# Patient Record
Sex: Female | Born: 1955 | Race: White | Hispanic: No | Smoking: Former smoker
Health system: Southern US, Community
[De-identification: ages and names within clinical notes are randomized; demographics above are authoritative.]

## PROBLEM LIST (undated history)

## (undated) DIAGNOSIS — Z8489 Family history of other specified conditions: Secondary | ICD-10-CM

## (undated) DIAGNOSIS — J329 Chronic sinusitis, unspecified: Secondary | ICD-10-CM

## (undated) DIAGNOSIS — E785 Hyperlipidemia, unspecified: Secondary | ICD-10-CM

## (undated) DIAGNOSIS — M1990 Unspecified osteoarthritis, unspecified site: Secondary | ICD-10-CM

## (undated) DIAGNOSIS — I10 Essential (primary) hypertension: Secondary | ICD-10-CM

## (undated) DIAGNOSIS — I739 Peripheral vascular disease, unspecified: Secondary | ICD-10-CM

## (undated) DIAGNOSIS — J439 Emphysema, unspecified: Secondary | ICD-10-CM

## (undated) DIAGNOSIS — R7303 Prediabetes: Secondary | ICD-10-CM

## (undated) DIAGNOSIS — Z720 Tobacco use: Secondary | ICD-10-CM

## (undated) DIAGNOSIS — R918 Other nonspecific abnormal finding of lung field: Secondary | ICD-10-CM

## (undated) DIAGNOSIS — M810 Age-related osteoporosis without current pathological fracture: Secondary | ICD-10-CM

## (undated) HISTORY — DX: Tobacco use: Z72.0

## (undated) HISTORY — PX: CATARACT EXTRACTION: SUR2

## (undated) HISTORY — PX: NASAL SINUS SURGERY: SHX719

---

## 1998-10-07 ENCOUNTER — Other Ambulatory Visit: Admission: RE | Admit: 1998-10-07 | Discharge: 1998-10-07 | Payer: Self-pay | Admitting: Obstetrics and Gynecology

## 1998-10-28 ENCOUNTER — Other Ambulatory Visit: Admission: RE | Admit: 1998-10-28 | Discharge: 1998-10-28 | Payer: Self-pay | Admitting: Obstetrics and Gynecology

## 1999-12-08 ENCOUNTER — Other Ambulatory Visit: Admission: RE | Admit: 1999-12-08 | Discharge: 1999-12-08 | Payer: Self-pay | Admitting: Obstetrics and Gynecology

## 2000-02-05 ENCOUNTER — Other Ambulatory Visit: Admission: RE | Admit: 2000-02-05 | Discharge: 2000-02-05 | Payer: Self-pay | Admitting: Obstetrics and Gynecology

## 2010-09-18 ENCOUNTER — Other Ambulatory Visit: Payer: Self-pay | Admitting: Family Medicine

## 2010-09-18 ENCOUNTER — Other Ambulatory Visit (HOSPITAL_COMMUNITY)
Admission: RE | Admit: 2010-09-18 | Discharge: 2010-09-18 | Disposition: A | Discharge status: Home / Self Care | Payer: 59 | Source: Ambulatory Visit | Attending: Family Medicine | Admitting: Family Medicine

## 2010-09-18 DIAGNOSIS — Z124 Encounter for screening for malignant neoplasm of cervix: Secondary | ICD-10-CM | POA: Insufficient documentation

## 2010-09-18 DIAGNOSIS — Z1159 Encounter for screening for other viral diseases: Secondary | ICD-10-CM | POA: Insufficient documentation

## 2014-03-08 ENCOUNTER — Ambulatory Visit (HOSPITAL_COMMUNITY)
Admission: RE | Admit: 2014-03-08 | Discharge: 2014-03-08 | Disposition: A | Discharge status: Home / Self Care | Payer: 59 | Source: Ambulatory Visit | Attending: Chiropractic Medicine | Admitting: Chiropractic Medicine

## 2014-03-08 ENCOUNTER — Other Ambulatory Visit (HOSPITAL_COMMUNITY): Payer: Self-pay | Admitting: Chiropractic Medicine

## 2014-03-08 DIAGNOSIS — M25519 Pain in unspecified shoulder: Secondary | ICD-10-CM | POA: Insufficient documentation

## 2014-03-08 DIAGNOSIS — M25819 Other specified joint disorders, unspecified shoulder: Secondary | ICD-10-CM | POA: Insufficient documentation

## 2014-03-08 DIAGNOSIS — M25512 Pain in left shoulder: Secondary | ICD-10-CM

## 2014-07-09 ENCOUNTER — Other Ambulatory Visit (HOSPITAL_COMMUNITY)
Admission: RE | Admit: 2014-07-09 | Discharge: 2014-07-09 | Disposition: A | Discharge status: Home / Self Care | Payer: 59 | Source: Ambulatory Visit | Attending: Family Medicine | Admitting: Family Medicine

## 2014-07-09 ENCOUNTER — Other Ambulatory Visit: Payer: Self-pay | Admitting: Family Medicine

## 2014-07-09 DIAGNOSIS — Z1151 Encounter for screening for human papillomavirus (HPV): Secondary | ICD-10-CM | POA: Insufficient documentation

## 2014-07-09 DIAGNOSIS — Z124 Encounter for screening for malignant neoplasm of cervix: Secondary | ICD-10-CM | POA: Insufficient documentation

## 2014-07-10 LAB — CYTOLOGY - PAP

## 2016-12-03 DIAGNOSIS — M7661 Achilles tendinitis, right leg: Secondary | ICD-10-CM | POA: Diagnosis not present

## 2016-12-03 DIAGNOSIS — M19171 Post-traumatic osteoarthritis, right ankle and foot: Secondary | ICD-10-CM | POA: Diagnosis not present

## 2016-12-09 DIAGNOSIS — M7661 Achilles tendinitis, right leg: Secondary | ICD-10-CM | POA: Diagnosis not present

## 2016-12-11 DIAGNOSIS — M7661 Achilles tendinitis, right leg: Secondary | ICD-10-CM | POA: Diagnosis not present

## 2017-01-26 DIAGNOSIS — N6311 Unspecified lump in the right breast, upper outer quadrant: Secondary | ICD-10-CM | POA: Diagnosis not present

## 2017-02-03 DIAGNOSIS — I10 Essential (primary) hypertension: Secondary | ICD-10-CM | POA: Diagnosis not present

## 2017-02-03 DIAGNOSIS — E785 Hyperlipidemia, unspecified: Secondary | ICD-10-CM | POA: Diagnosis not present

## 2017-02-03 DIAGNOSIS — J302 Other seasonal allergic rhinitis: Secondary | ICD-10-CM | POA: Diagnosis not present

## 2017-02-03 DIAGNOSIS — M19171 Post-traumatic osteoarthritis, right ankle and foot: Secondary | ICD-10-CM | POA: Diagnosis not present

## 2017-02-03 DIAGNOSIS — M7661 Achilles tendinitis, right leg: Secondary | ICD-10-CM | POA: Diagnosis not present

## 2017-03-08 DIAGNOSIS — G5600 Carpal tunnel syndrome, unspecified upper limb: Secondary | ICD-10-CM | POA: Diagnosis not present

## 2017-07-28 DIAGNOSIS — N6311 Unspecified lump in the right breast, upper outer quadrant: Secondary | ICD-10-CM | POA: Diagnosis not present

## 2017-07-28 DIAGNOSIS — R922 Inconclusive mammogram: Secondary | ICD-10-CM | POA: Diagnosis not present

## 2017-07-29 ENCOUNTER — Other Ambulatory Visit (HOSPITAL_COMMUNITY): Payer: Self-pay | Admitting: Family Medicine

## 2017-07-29 DIAGNOSIS — Z0000 Encounter for general adult medical examination without abnormal findings: Secondary | ICD-10-CM | POA: Diagnosis not present

## 2017-07-29 DIAGNOSIS — J302 Other seasonal allergic rhinitis: Secondary | ICD-10-CM | POA: Diagnosis not present

## 2017-07-29 DIAGNOSIS — R011 Cardiac murmur, unspecified: Secondary | ICD-10-CM

## 2017-07-29 DIAGNOSIS — I10 Essential (primary) hypertension: Secondary | ICD-10-CM | POA: Diagnosis not present

## 2017-07-29 DIAGNOSIS — E785 Hyperlipidemia, unspecified: Secondary | ICD-10-CM | POA: Diagnosis not present

## 2017-08-02 ENCOUNTER — Ambulatory Visit (HOSPITAL_COMMUNITY): Payer: 59

## 2017-08-25 ENCOUNTER — Ambulatory Visit (HOSPITAL_COMMUNITY): Payer: 59

## 2017-09-29 DIAGNOSIS — J0140 Acute pansinusitis, unspecified: Secondary | ICD-10-CM | POA: Diagnosis not present

## 2017-10-19 ENCOUNTER — Telehealth: Payer: Self-pay | Admitting: Acute Care

## 2017-10-22 NOTE — Telephone Encounter (Signed)
Spoke with pt.  [facility]  Will close this message and refer to referral notes.

## 2017-10-22 NOTE — Telephone Encounter (Signed)
LMTC x 1  

## 2017-12-09 DIAGNOSIS — M25571 Pain in right ankle and joints of right foot: Secondary | ICD-10-CM | POA: Diagnosis not present

## 2017-12-09 DIAGNOSIS — M12571 Traumatic arthropathy, right ankle and foot: Secondary | ICD-10-CM | POA: Diagnosis not present

## 2018-02-02 DIAGNOSIS — N6311 Unspecified lump in the right breast, upper outer quadrant: Secondary | ICD-10-CM | POA: Diagnosis not present

## 2018-03-15 DIAGNOSIS — I10 Essential (primary) hypertension: Secondary | ICD-10-CM | POA: Diagnosis not present

## 2018-03-15 DIAGNOSIS — J019 Acute sinusitis, unspecified: Secondary | ICD-10-CM | POA: Diagnosis not present

## 2018-03-15 DIAGNOSIS — E785 Hyperlipidemia, unspecified: Secondary | ICD-10-CM | POA: Diagnosis not present

## 2018-03-22 DIAGNOSIS — J0110 Acute frontal sinusitis, unspecified: Secondary | ICD-10-CM | POA: Diagnosis not present

## 2018-03-22 DIAGNOSIS — J0100 Acute maxillary sinusitis, unspecified: Secondary | ICD-10-CM | POA: Diagnosis not present

## 2018-05-16 DIAGNOSIS — I10 Essential (primary) hypertension: Secondary | ICD-10-CM | POA: Diagnosis not present

## 2018-05-16 DIAGNOSIS — Z87891 Personal history of nicotine dependence: Secondary | ICD-10-CM | POA: Diagnosis not present

## 2018-05-16 DIAGNOSIS — J302 Other seasonal allergic rhinitis: Secondary | ICD-10-CM | POA: Diagnosis not present

## 2018-05-26 DIAGNOSIS — H6122 Impacted cerumen, left ear: Secondary | ICD-10-CM | POA: Diagnosis not present

## 2018-05-26 DIAGNOSIS — J019 Acute sinusitis, unspecified: Secondary | ICD-10-CM | POA: Diagnosis not present

## 2018-08-11 DIAGNOSIS — J019 Acute sinusitis, unspecified: Secondary | ICD-10-CM | POA: Diagnosis not present

## 2018-09-19 DIAGNOSIS — I10 Essential (primary) hypertension: Secondary | ICD-10-CM | POA: Diagnosis not present

## 2018-09-19 DIAGNOSIS — J302 Other seasonal allergic rhinitis: Secondary | ICD-10-CM | POA: Diagnosis not present

## 2018-09-19 DIAGNOSIS — E785 Hyperlipidemia, unspecified: Secondary | ICD-10-CM | POA: Diagnosis not present

## 2018-09-30 DIAGNOSIS — E871 Hypo-osmolality and hyponatremia: Secondary | ICD-10-CM | POA: Diagnosis not present

## 2018-11-08 DIAGNOSIS — I10 Essential (primary) hypertension: Secondary | ICD-10-CM | POA: Diagnosis not present

## 2018-12-05 DIAGNOSIS — J302 Other seasonal allergic rhinitis: Secondary | ICD-10-CM | POA: Diagnosis not present

## 2018-12-05 DIAGNOSIS — I10 Essential (primary) hypertension: Secondary | ICD-10-CM | POA: Diagnosis not present

## 2018-12-05 DIAGNOSIS — Z87891 Personal history of nicotine dependence: Secondary | ICD-10-CM | POA: Diagnosis not present

## 2018-12-12 DIAGNOSIS — I10 Essential (primary) hypertension: Secondary | ICD-10-CM | POA: Diagnosis not present

## 2018-12-12 DIAGNOSIS — J302 Other seasonal allergic rhinitis: Secondary | ICD-10-CM | POA: Diagnosis not present

## 2019-11-13 ENCOUNTER — Other Ambulatory Visit: Payer: Self-pay | Admitting: *Deleted

## 2019-11-13 DIAGNOSIS — Z87891 Personal history of nicotine dependence: Secondary | ICD-10-CM

## 2019-12-04 ENCOUNTER — Encounter: Payer: Self-pay | Admitting: Acute Care

## 2019-12-04 ENCOUNTER — Ambulatory Visit (INDEPENDENT_AMBULATORY_CARE_PROVIDER_SITE_OTHER)
Admission: RE | Admit: 2019-12-04 | Discharge: 2019-12-04 | Disposition: A | Discharge status: Home / Self Care | Payer: Medicare Other | Source: Ambulatory Visit | Attending: Acute Care | Admitting: Acute Care

## 2019-12-04 ENCOUNTER — Ambulatory Visit (INDEPENDENT_AMBULATORY_CARE_PROVIDER_SITE_OTHER): Payer: Medicare Other | Admitting: Acute Care

## 2019-12-04 ENCOUNTER — Other Ambulatory Visit: Payer: Self-pay

## 2019-12-04 DIAGNOSIS — Z87891 Personal history of nicotine dependence: Secondary | ICD-10-CM

## 2019-12-04 DIAGNOSIS — Z122 Encounter for screening for malignant neoplasm of respiratory organs: Secondary | ICD-10-CM

## 2019-12-04 NOTE — Patient Instructions (Signed)
Thank you for participating in the [facility]. It was our pleasure to meet you today. We will call you with the results of your scan within the next few days. Your scan will be assigned a Lung RADS category score by the physicians reading the scans.  This Lung RADS score determines follow up scanning.  See below for description of categories, and follow up screening recommendations. We will be in touch to schedule your follow up screening annually or based on recommendations of our providers. We will fax a copy of your scan results to your Primary Care Physician, or the physician who referred you to the program, to ensure they have the results. Please call the office if you have any questions or concerns regarding your scanning experience or results.  Our office number is [phone]. Please speak with Denise Phelps, RN. She is our Lung Cancer Screening RN. If she is unavailable when you call, please have the office staff send her a message. She will return your call at her earliest convenience. Remember, if your scan is normal, we will scan you annually as long as you continue to meet the criteria for the program. (Age 55-77, Current smoker or smoker who has quit within the last 15 years). If you are a smoker, remember, quitting is the single most powerful action that you can take to decrease your risk of lung cancer and other pulmonary, breathing related problems. We know quitting is hard, and we are here to help.  Please let us know if there is anything we can do to help you meet your goal of quitting. If you are a former smoker, congratulations. We are proud of you! Remain smoke free! Remember you can refer friends or family members through the number above.  We will screen them to make sure they meet criteria for the program. Thank you for helping us take better care of you by participating in Lung Screening.  Lung RADS Categories:  Lung RADS 1: no nodules  or definitely non-concerning nodules.  Recommendation is for a repeat annual scan in 12 months.  Lung RADS 2:  nodules that are non-concerning in appearance and behavior with a very low likelihood of becoming an active cancer. Recommendation is for a repeat annual scan in 12 months.  Lung RADS 3: nodules that are probably non-concerning , includes nodules with a low likelihood of becoming an active cancer.  Recommendation is for a 6-month repeat screening scan. Often noted after an upper respiratory illness. We will be in touch to make sure you have no questions, and to schedule your 6-month scan.  Lung RADS 4 A: nodules with concerning findings, recommendation is most often for a follow up scan in 3 months or additional testing based on our provider's assessment of the scan. We will be in touch to make sure you have no questions and to schedule the recommended 3 month follow up scan.  Lung RADS 4 B:  indicates findings that are concerning. We will be in touch with you to schedule additional diagnostic testing based on our provider's  assessment of the scan.   

## 2019-12-04 NOTE — Progress Notes (Signed)
Shared Decision Making Visit Lung Cancer Screening Program [phone])   Eligibility:  Age 64 y.o.  Pack Years Smoking History Calculation 60 pack year smoking history (# packs/per year x # years smoked)  Recent History of coughing up blood  no  Unexplained weight loss? no ( >Than 15 pounds within the last 6 months )  Prior History Lung / other cancer no (Diagnosis within the last 5 years already requiring surveillance chest CT Scans).  Smoking Status Former Smoker  Former Smokers: Years since quit: 2 years  Quit Date: 9/13/018  Visit Components:  Discussion included one or more decision making aids. yes  Discussion included risk/benefits of screening. yes  Discussion included potential follow up diagnostic testing for abnormal scans. yes  Discussion included meaning and risk of over diagnosis. yes  Discussion included meaning and risk of False Positives. yes  Discussion included meaning of total radiation exposure. yes  Counseling Included:  Importance of adherence to annual lung cancer LDCT screening. yes  Impact of comorbidities on ability to participate in the program. yes  Ability and willingness to under diagnostic treatment. yes  Smoking Cessation Counseling:  Current Smokers:   Discussed importance of smoking cessation.NA Former smoker  Information about tobacco cessation classes and interventions provided to patient. yes  Patient provided with "ticket" for LDCT Scan. yes  Symptomatic Patient. no  Counseling  Diagnosis Code: Tobacco Use Z72.0  Asymptomatic Patient yes  Counseling (Intermediate counseling: > three minutes counseling) P8242  Former Smokers:   Discussed the importance of maintaining cigarette abstinence. yes  Diagnosis Code: Personal History of Nicotine Dependence. P53.614  Information about tobacco cessation classes and interventions provided to patient. Yes  Patient provided with "ticket" for LDCT Scan. yes  Written Order  for Lung Cancer Screening with LDCT placed in Epic. Yes (CT Chest Lung Cancer Screening Low Dose W/O CM) [id number] Z12.2-Screening of respiratory organs Z87.891-Personal history of nicotine dependence  I spent 25 minutes of face to face time with Ms.Portnoy discussing the risks and benefits of lung cancer screening. We viewed a power point together that explained in detail the above noted topics. We took the time to pause the power point at intervals to allow for questions to be asked and answered to ensure understanding. We discussed that she had taken the single most powerful action possible to decrease her risk of developing lung cancer when she quit smoking. I counseled her to remain smoke free, and to contact me if she ever had the desire to smoke again so that I can provide resources and tools to help support the effort to remain smoke free. We discussed the time and location of the scan, and that either  Doroteo Glassman RN or I will call with the results within  24-48 hours of receiving them. She has my card and contact information in the event she needs to speak with me, in addition to a copy of the power point we reviewed as a resource. She verbalized understanding of all of the above and had no further questions upon leaving the office.     I explained to the patient that there has been a high incidence of coronary artery disease noted on these exams. I explained that this is a non-gated exam therefore degree or severity cannot be determined. This patient is currently on statin therapy. I have asked the patient to follow-up with their PCP regarding any incidental finding of coronary artery disease and management with diet or medication as they feel is clinically  indicated. The patient verbalized understanding of the above and had no further questions.     Bevelyn Ngo, NP 12/04/2019

## 2019-12-07 ENCOUNTER — Telehealth: Payer: Self-pay | Admitting: Acute Care

## 2019-12-07 DIAGNOSIS — Z87891 Personal history of nicotine dependence: Secondary | ICD-10-CM

## 2019-12-07 NOTE — Progress Notes (Signed)
I have attempted to call the patient with the results of her scan. There was no answer at either home or cell numbers. I have left a HIPPA compliant message to call the office at [phone] so that we can review the results of her scan with her. We will await her return call. If we do not hear from her we will call again. Marland Kitchen

## 2019-12-07 NOTE — Telephone Encounter (Signed)
SG please advise.

## 2019-12-07 NOTE — Progress Notes (Signed)
Please call patient and let them  know their  low dose Ct was read as a Lung  RADS 3, nodules that are probably benign findings, short term follow up suggested: includes nodules with a low likelihood of becoming a clinically active cancer. Radiology recommends a 6 month repeat LDCT follow up. .Please let them  know we will order and schedule their  annual screening scan for 11/2020. Please let them  know there was notation of CAD on their  scan.  Please remind the patient  that this is a non-gated exam therefore degree or severity of disease  cannot be determined. Please have them  follow up with their PCP regarding potential risk factor modification, dietary therapy or pharmacologic therapy if clinically indicated. Pt.  is  currently on statin therapy. Please place order for annual  screening scan for  11/2020 and fax results to PCP. ( Who is Dr. Sigmund Hazel, not Dr. Ihor Dow) Thanks so much.

## 2019-12-07 NOTE — Telephone Encounter (Signed)
I have called the patient with the results. She will be scheduled for a 6 month follow up in 05/2020. She knows to call the office with any changes in her health. Denise, please schedule for 6 month follow up and send results to Dr. Sigmund Hazel. Thanks so much

## 2019-12-08 NOTE — Addendum Note (Signed)
Addended by: Abigail Miyamoto D on: 12/08/2019 09:32 AM   Modules accepted: Orders

## 2019-12-08 NOTE — Telephone Encounter (Signed)
CT results faxed to Dr Sigmund Hazel. Order placed for 6 month f/u low dose chest ct.

## 2020-06-12 ENCOUNTER — Inpatient Hospital Stay: Admission: RE | Admit: 2020-06-12 | Payer: Medicare Other | Source: Ambulatory Visit

## 2020-06-26 ENCOUNTER — Ambulatory Visit (INDEPENDENT_AMBULATORY_CARE_PROVIDER_SITE_OTHER)
Admission: RE | Admit: 2020-06-26 | Discharge: 2020-06-26 | Disposition: A | Discharge status: Home / Self Care | Payer: Medicare Other | Source: Ambulatory Visit | Attending: Acute Care | Admitting: Acute Care

## 2020-06-26 ENCOUNTER — Other Ambulatory Visit: Payer: Self-pay

## 2020-06-26 DIAGNOSIS — Z87891 Personal history of nicotine dependence: Secondary | ICD-10-CM | POA: Diagnosis not present

## 2020-06-27 NOTE — Progress Notes (Signed)

## 2020-06-28 ENCOUNTER — Other Ambulatory Visit: Payer: Self-pay | Admitting: *Deleted

## 2020-06-28 DIAGNOSIS — Z87891 Personal history of nicotine dependence: Secondary | ICD-10-CM

## 2020-08-15 DIAGNOSIS — R0981 Nasal congestion: Secondary | ICD-10-CM | POA: Diagnosis not present

## 2020-08-15 DIAGNOSIS — J3489 Other specified disorders of nose and nasal sinuses: Secondary | ICD-10-CM | POA: Diagnosis not present

## 2020-08-16 DIAGNOSIS — Z20822 Contact with and (suspected) exposure to covid-19: Secondary | ICD-10-CM | POA: Diagnosis not present

## 2020-08-16 DIAGNOSIS — R0981 Nasal congestion: Secondary | ICD-10-CM | POA: Diagnosis not present

## 2020-08-23 DIAGNOSIS — J329 Chronic sinusitis, unspecified: Secondary | ICD-10-CM | POA: Diagnosis not present

## 2020-08-23 DIAGNOSIS — J322 Chronic ethmoidal sinusitis: Secondary | ICD-10-CM | POA: Diagnosis not present

## 2020-08-23 DIAGNOSIS — J3489 Other specified disorders of nose and nasal sinuses: Secondary | ICD-10-CM | POA: Diagnosis not present

## 2020-08-23 DIAGNOSIS — J320 Chronic maxillary sinusitis: Secondary | ICD-10-CM | POA: Diagnosis not present

## 2020-08-23 DIAGNOSIS — J0100 Acute maxillary sinusitis, unspecified: Secondary | ICD-10-CM | POA: Diagnosis not present

## 2020-11-15 DIAGNOSIS — Z0000 Encounter for general adult medical examination without abnormal findings: Secondary | ICD-10-CM | POA: Diagnosis not present

## 2020-12-05 DIAGNOSIS — J339 Nasal polyp, unspecified: Secondary | ICD-10-CM | POA: Diagnosis not present

## 2020-12-05 DIAGNOSIS — J324 Chronic pansinusitis: Secondary | ICD-10-CM | POA: Diagnosis not present

## 2021-01-24 DIAGNOSIS — I10 Essential (primary) hypertension: Secondary | ICD-10-CM | POA: Diagnosis not present

## 2021-01-24 DIAGNOSIS — R0981 Nasal congestion: Secondary | ICD-10-CM | POA: Diagnosis not present

## 2021-01-24 DIAGNOSIS — M810 Age-related osteoporosis without current pathological fracture: Secondary | ICD-10-CM | POA: Diagnosis not present

## 2021-01-24 DIAGNOSIS — Z23 Encounter for immunization: Secondary | ICD-10-CM | POA: Diagnosis not present

## 2021-01-24 DIAGNOSIS — E785 Hyperlipidemia, unspecified: Secondary | ICD-10-CM | POA: Diagnosis not present

## 2021-01-24 DIAGNOSIS — J439 Emphysema, unspecified: Secondary | ICD-10-CM | POA: Diagnosis not present

## 2021-01-24 DIAGNOSIS — Z87891 Personal history of nicotine dependence: Secondary | ICD-10-CM | POA: Diagnosis not present

## 2021-01-24 DIAGNOSIS — I700 Atherosclerosis of aorta: Secondary | ICD-10-CM | POA: Diagnosis not present

## 2021-03-21 DIAGNOSIS — Z1231 Encounter for screening mammogram for malignant neoplasm of breast: Secondary | ICD-10-CM | POA: Diagnosis not present

## 2021-04-10 DIAGNOSIS — H2513 Age-related nuclear cataract, bilateral: Secondary | ICD-10-CM | POA: Diagnosis not present

## 2021-05-23 DIAGNOSIS — H25013 Cortical age-related cataract, bilateral: Secondary | ICD-10-CM | POA: Diagnosis not present

## 2021-05-23 DIAGNOSIS — H25043 Posterior subcapsular polar age-related cataract, bilateral: Secondary | ICD-10-CM | POA: Diagnosis not present

## 2021-05-23 DIAGNOSIS — H18413 Arcus senilis, bilateral: Secondary | ICD-10-CM | POA: Diagnosis not present

## 2021-05-23 DIAGNOSIS — H2513 Age-related nuclear cataract, bilateral: Secondary | ICD-10-CM | POA: Diagnosis not present

## 2021-05-23 DIAGNOSIS — H2511 Age-related nuclear cataract, right eye: Secondary | ICD-10-CM | POA: Diagnosis not present

## 2021-05-28 DIAGNOSIS — J018 Other acute sinusitis: Secondary | ICD-10-CM | POA: Diagnosis not present

## 2021-06-09 DIAGNOSIS — J209 Acute bronchitis, unspecified: Secondary | ICD-10-CM | POA: Diagnosis not present

## 2021-06-09 DIAGNOSIS — R059 Cough, unspecified: Secondary | ICD-10-CM | POA: Diagnosis not present

## 2021-07-09 DIAGNOSIS — J339 Nasal polyp, unspecified: Secondary | ICD-10-CM | POA: Diagnosis not present

## 2021-07-09 DIAGNOSIS — J324 Chronic pansinusitis: Secondary | ICD-10-CM | POA: Diagnosis not present

## 2021-07-23 DIAGNOSIS — H2511 Age-related nuclear cataract, right eye: Secondary | ICD-10-CM | POA: Diagnosis not present

## 2021-07-24 DIAGNOSIS — H2512 Age-related nuclear cataract, left eye: Secondary | ICD-10-CM | POA: Diagnosis not present

## 2021-08-09 DIAGNOSIS — J439 Emphysema, unspecified: Secondary | ICD-10-CM | POA: Diagnosis not present

## 2021-08-09 DIAGNOSIS — E785 Hyperlipidemia, unspecified: Secondary | ICD-10-CM | POA: Diagnosis not present

## 2021-08-09 DIAGNOSIS — I10 Essential (primary) hypertension: Secondary | ICD-10-CM | POA: Diagnosis not present

## 2021-08-09 DIAGNOSIS — M810 Age-related osteoporosis without current pathological fracture: Secondary | ICD-10-CM | POA: Diagnosis not present

## 2021-08-20 DIAGNOSIS — K5730 Diverticulosis of large intestine without perforation or abscess without bleeding: Secondary | ICD-10-CM | POA: Diagnosis not present

## 2021-08-20 DIAGNOSIS — K621 Rectal polyp: Secondary | ICD-10-CM | POA: Diagnosis not present

## 2021-08-20 DIAGNOSIS — K644 Residual hemorrhoidal skin tags: Secondary | ICD-10-CM | POA: Diagnosis not present

## 2021-08-20 DIAGNOSIS — Z1211 Encounter for screening for malignant neoplasm of colon: Secondary | ICD-10-CM | POA: Diagnosis not present

## 2021-08-22 DIAGNOSIS — K621 Rectal polyp: Secondary | ICD-10-CM | POA: Diagnosis not present

## 2021-09-12 DIAGNOSIS — I10 Essential (primary) hypertension: Secondary | ICD-10-CM | POA: Diagnosis not present

## 2021-09-12 DIAGNOSIS — R000 Tachycardia, unspecified: Secondary | ICD-10-CM | POA: Diagnosis not present

## 2021-09-17 DIAGNOSIS — R0689 Other abnormalities of breathing: Secondary | ICD-10-CM | POA: Diagnosis not present

## 2021-10-16 DIAGNOSIS — H2512 Age-related nuclear cataract, left eye: Secondary | ICD-10-CM | POA: Diagnosis not present

## 2021-11-30 DIAGNOSIS — H2512 Age-related nuclear cataract, left eye: Secondary | ICD-10-CM | POA: Diagnosis not present

## 2021-12-03 DIAGNOSIS — H2512 Age-related nuclear cataract, left eye: Secondary | ICD-10-CM | POA: Diagnosis not present

## 2021-12-05 DIAGNOSIS — Z0000 Encounter for general adult medical examination without abnormal findings: Secondary | ICD-10-CM | POA: Diagnosis not present

## 2021-12-08 DIAGNOSIS — E785 Hyperlipidemia, unspecified: Secondary | ICD-10-CM | POA: Diagnosis not present

## 2021-12-08 DIAGNOSIS — I739 Peripheral vascular disease, unspecified: Secondary | ICD-10-CM | POA: Diagnosis not present

## 2021-12-08 DIAGNOSIS — I10 Essential (primary) hypertension: Secondary | ICD-10-CM | POA: Diagnosis not present

## 2021-12-08 DIAGNOSIS — M810 Age-related osteoporosis without current pathological fracture: Secondary | ICD-10-CM | POA: Diagnosis not present

## 2021-12-15 DIAGNOSIS — I10 Essential (primary) hypertension: Secondary | ICD-10-CM | POA: Diagnosis not present

## 2021-12-15 DIAGNOSIS — M810 Age-related osteoporosis without current pathological fracture: Secondary | ICD-10-CM | POA: Diagnosis not present

## 2021-12-15 DIAGNOSIS — I700 Atherosclerosis of aorta: Secondary | ICD-10-CM | POA: Diagnosis not present

## 2021-12-15 DIAGNOSIS — Z87891 Personal history of nicotine dependence: Secondary | ICD-10-CM | POA: Diagnosis not present

## 2021-12-15 DIAGNOSIS — E785 Hyperlipidemia, unspecified: Secondary | ICD-10-CM | POA: Diagnosis not present

## 2021-12-26 DIAGNOSIS — M810 Age-related osteoporosis without current pathological fracture: Secondary | ICD-10-CM | POA: Diagnosis not present

## 2021-12-26 DIAGNOSIS — M8589 Other specified disorders of bone density and structure, multiple sites: Secondary | ICD-10-CM | POA: Diagnosis not present

## 2022-01-29 ENCOUNTER — Other Ambulatory Visit: Payer: Self-pay | Admitting: *Deleted

## 2022-01-29 DIAGNOSIS — Z87891 Personal history of nicotine dependence: Secondary | ICD-10-CM

## 2022-01-29 DIAGNOSIS — Z122 Encounter for screening for malignant neoplasm of respiratory organs: Secondary | ICD-10-CM

## 2022-02-09 ENCOUNTER — Ambulatory Visit
Admission: RE | Admit: 2022-02-09 | Discharge: 2022-02-09 | Disposition: A | Discharge status: Home / Self Care | Payer: Medicare Other | Source: Ambulatory Visit | Attending: Acute Care | Admitting: Acute Care

## 2022-02-09 DIAGNOSIS — Z87891 Personal history of nicotine dependence: Secondary | ICD-10-CM | POA: Diagnosis not present

## 2022-02-09 DIAGNOSIS — Z122 Encounter for screening for malignant neoplasm of respiratory organs: Secondary | ICD-10-CM

## 2022-02-18 ENCOUNTER — Telehealth: Payer: Self-pay | Admitting: Acute Care

## 2022-02-18 NOTE — Telephone Encounter (Signed)
I have attempted to call the patient with the results of their  Low Dose CT Chest Lung cancer screening scan. There was no answer. I have left a HIPPA compliant VM requesting the patient call the office for the scan results. I included the office contact information in the message. We will await his return call. If no return call we will continue to call until patient is contacted.    Angelique Blonder, it is fine for you to call this patient. Ask her if she was sick when she had the scan done. Let her know there is an indication of infection and have her follow up with her pcp if she has symptoms.  She will get a 12 month follow up. Thanks so much

## 2022-02-19 ENCOUNTER — Other Ambulatory Visit: Payer: Self-pay

## 2022-02-19 DIAGNOSIS — Z87891 Personal history of nicotine dependence: Secondary | ICD-10-CM

## 2022-02-19 DIAGNOSIS — Z122 Encounter for screening for malignant neoplasm of respiratory organs: Secondary | ICD-10-CM

## 2022-02-24 NOTE — Telephone Encounter (Signed)
Called and spoke with pt regarding CT results. Pt reports that she has been having sinus and chest congestion with drainage down her throat. She has an appt coming up with her PCP and she is planning on discussing this at her appt. Pt advised that copy of CT has been sent to PCP. Pt aware that we will call her closer to 12 mth to schedule her next lung screening CT.

## 2022-02-25 DIAGNOSIS — R051 Acute cough: Secondary | ICD-10-CM | POA: Diagnosis not present

## 2022-03-23 DIAGNOSIS — R0602 Shortness of breath: Secondary | ICD-10-CM | POA: Diagnosis not present

## 2022-03-23 DIAGNOSIS — R051 Acute cough: Secondary | ICD-10-CM | POA: Diagnosis not present

## 2022-03-23 DIAGNOSIS — R5383 Other fatigue: Secondary | ICD-10-CM | POA: Diagnosis not present

## 2022-03-23 DIAGNOSIS — M6281 Muscle weakness (generalized): Secondary | ICD-10-CM | POA: Diagnosis not present

## 2022-03-27 DIAGNOSIS — Z1231 Encounter for screening mammogram for malignant neoplasm of breast: Secondary | ICD-10-CM | POA: Diagnosis not present

## 2022-05-15 DIAGNOSIS — M3501 Sicca syndrome with keratoconjunctivitis: Secondary | ICD-10-CM | POA: Diagnosis not present

## 2022-05-25 DIAGNOSIS — J0140 Acute pansinusitis, unspecified: Secondary | ICD-10-CM | POA: Diagnosis not present

## 2022-06-03 DIAGNOSIS — R051 Acute cough: Secondary | ICD-10-CM | POA: Diagnosis not present

## 2022-06-03 DIAGNOSIS — J0140 Acute pansinusitis, unspecified: Secondary | ICD-10-CM | POA: Diagnosis not present

## 2022-07-03 DIAGNOSIS — J0100 Acute maxillary sinusitis, unspecified: Secondary | ICD-10-CM | POA: Diagnosis not present

## 2022-07-06 DIAGNOSIS — J019 Acute sinusitis, unspecified: Secondary | ICD-10-CM | POA: Diagnosis not present

## 2022-07-06 DIAGNOSIS — R0981 Nasal congestion: Secondary | ICD-10-CM | POA: Diagnosis not present

## 2022-08-04 DIAGNOSIS — J439 Emphysema, unspecified: Secondary | ICD-10-CM | POA: Diagnosis not present

## 2022-08-04 DIAGNOSIS — J302 Other seasonal allergic rhinitis: Secondary | ICD-10-CM | POA: Diagnosis not present

## 2022-08-04 DIAGNOSIS — E7800 Pure hypercholesterolemia, unspecified: Secondary | ICD-10-CM | POA: Diagnosis not present

## 2022-08-04 DIAGNOSIS — I10 Essential (primary) hypertension: Secondary | ICD-10-CM | POA: Diagnosis not present

## 2022-08-04 DIAGNOSIS — E559 Vitamin D deficiency, unspecified: Secondary | ICD-10-CM | POA: Diagnosis not present

## 2022-08-04 DIAGNOSIS — Z23 Encounter for immunization: Secondary | ICD-10-CM | POA: Diagnosis not present

## 2022-08-04 DIAGNOSIS — E785 Hyperlipidemia, unspecified: Secondary | ICD-10-CM | POA: Diagnosis not present

## 2022-08-19 DIAGNOSIS — M79672 Pain in left foot: Secondary | ICD-10-CM | POA: Diagnosis not present

## 2022-08-23 DIAGNOSIS — J019 Acute sinusitis, unspecified: Secondary | ICD-10-CM | POA: Diagnosis not present

## 2022-08-24 DIAGNOSIS — H26493 Other secondary cataract, bilateral: Secondary | ICD-10-CM | POA: Diagnosis not present

## 2022-09-02 DIAGNOSIS — J343 Hypertrophy of nasal turbinates: Secondary | ICD-10-CM | POA: Diagnosis not present

## 2022-09-02 DIAGNOSIS — J324 Chronic pansinusitis: Secondary | ICD-10-CM | POA: Diagnosis not present

## 2022-09-07 DIAGNOSIS — J0100 Acute maxillary sinusitis, unspecified: Secondary | ICD-10-CM | POA: Diagnosis not present

## 2022-09-07 DIAGNOSIS — J324 Chronic pansinusitis: Secondary | ICD-10-CM | POA: Diagnosis not present

## 2022-09-25 ENCOUNTER — Other Ambulatory Visit: Payer: Self-pay | Admitting: Family Medicine

## 2022-09-25 DIAGNOSIS — K439 Ventral hernia without obstruction or gangrene: Secondary | ICD-10-CM | POA: Diagnosis not present

## 2022-09-25 DIAGNOSIS — R0989 Other specified symptoms and signs involving the circulatory and respiratory systems: Secondary | ICD-10-CM | POA: Diagnosis not present

## 2022-10-01 ENCOUNTER — Other Ambulatory Visit (HOSPITAL_COMMUNITY): Payer: Self-pay | Admitting: Family Medicine

## 2022-10-01 DIAGNOSIS — R0989 Other specified symptoms and signs involving the circulatory and respiratory systems: Secondary | ICD-10-CM

## 2022-10-05 ENCOUNTER — Ambulatory Visit (HOSPITAL_BASED_OUTPATIENT_CLINIC_OR_DEPARTMENT_OTHER)
Admission: RE | Admit: 2022-10-05 | Discharge: 2022-10-05 | Disposition: A | Discharge status: Home / Self Care | Payer: Medicare Other | Source: Ambulatory Visit | Attending: Family Medicine | Admitting: Family Medicine

## 2022-10-05 DIAGNOSIS — I739 Peripheral vascular disease, unspecified: Secondary | ICD-10-CM | POA: Insufficient documentation

## 2022-10-05 DIAGNOSIS — Z87891 Personal history of nicotine dependence: Secondary | ICD-10-CM | POA: Diagnosis not present

## 2022-10-05 DIAGNOSIS — R0989 Other specified symptoms and signs involving the circulatory and respiratory systems: Secondary | ICD-10-CM | POA: Insufficient documentation

## 2022-10-05 DIAGNOSIS — Z136 Encounter for screening for cardiovascular disorders: Secondary | ICD-10-CM | POA: Diagnosis not present

## 2022-10-05 DIAGNOSIS — I10 Essential (primary) hypertension: Secondary | ICD-10-CM | POA: Diagnosis not present

## 2022-10-05 DIAGNOSIS — R109 Unspecified abdominal pain: Secondary | ICD-10-CM | POA: Diagnosis not present

## 2022-10-05 DIAGNOSIS — R011 Cardiac murmur, unspecified: Secondary | ICD-10-CM | POA: Diagnosis not present

## 2022-10-09 ENCOUNTER — Other Ambulatory Visit: Payer: Self-pay | Admitting: Otolaryngology

## 2022-10-09 DIAGNOSIS — J343 Hypertrophy of nasal turbinates: Secondary | ICD-10-CM | POA: Diagnosis not present

## 2022-10-09 DIAGNOSIS — J339 Nasal polyp, unspecified: Secondary | ICD-10-CM | POA: Diagnosis not present

## 2022-10-09 DIAGNOSIS — J324 Chronic pansinusitis: Secondary | ICD-10-CM | POA: Diagnosis not present

## 2022-10-27 DIAGNOSIS — J324 Chronic pansinusitis: Secondary | ICD-10-CM | POA: Diagnosis not present

## 2022-10-27 DIAGNOSIS — J339 Nasal polyp, unspecified: Secondary | ICD-10-CM | POA: Diagnosis not present

## 2022-10-28 ENCOUNTER — Other Ambulatory Visit: Payer: Medicare Other

## 2022-11-03 IMAGING — CT CT CHEST LCS NODULE FOLLOW-UP W/O CM
1 of 2 series · 10 of 20 positions shown, 13 images · non-contrast
Comparison: 12/04/2019

CLINICAL DATA: Lung cancer screening. Sixty-four pack-year history.
Former asymptomatic smoker.

EXAM:
CT CHEST WITHOUT CONTRAST FOR LUNG CANCER SCREENING NODULE FOLLOW-UP
TECHNIQUE: Multidetector CT imaging of the chest was performed following the
standard protocol without IV contrast.

[ct lung segmentation data · axial · 0.64mm/px · z∈[-259,-259]mm · 10 of 275 frames shown]
[frame 1/275  mediastinal]
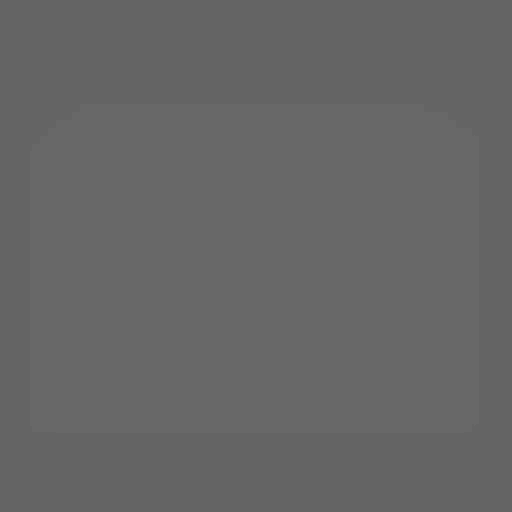
[frame 1/275  lung]
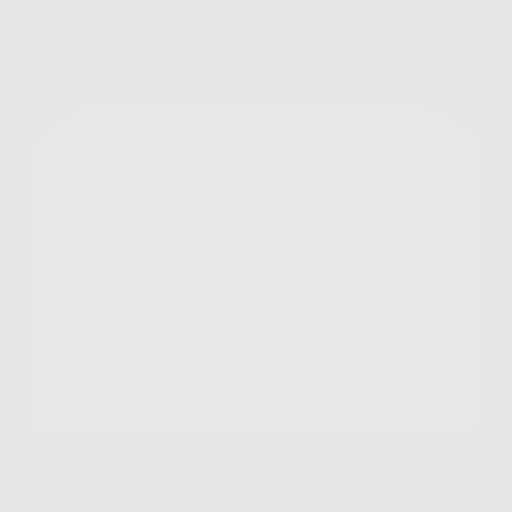
[frame 31/275  lung]
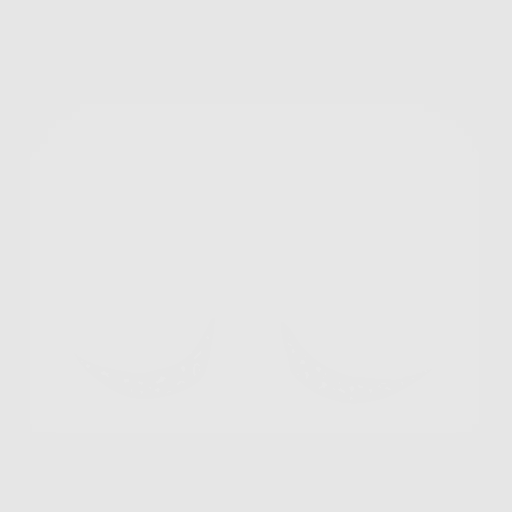
[frame 61/275  lung]
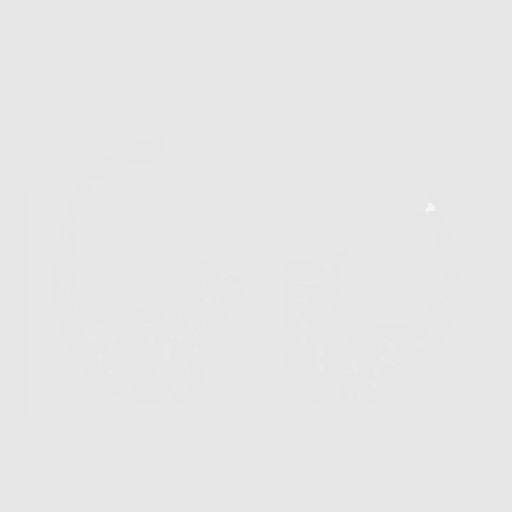
[frame 92/275  lung]
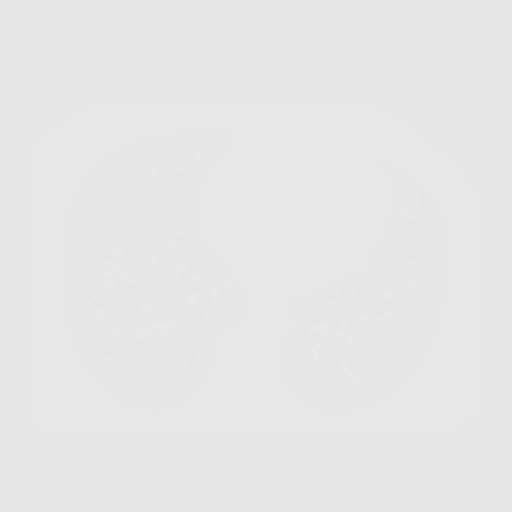
[frame 122/275  mediastinal]
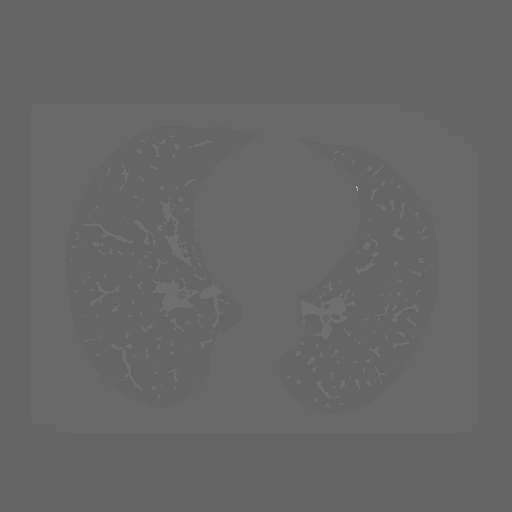
[frame 122/275  lung]
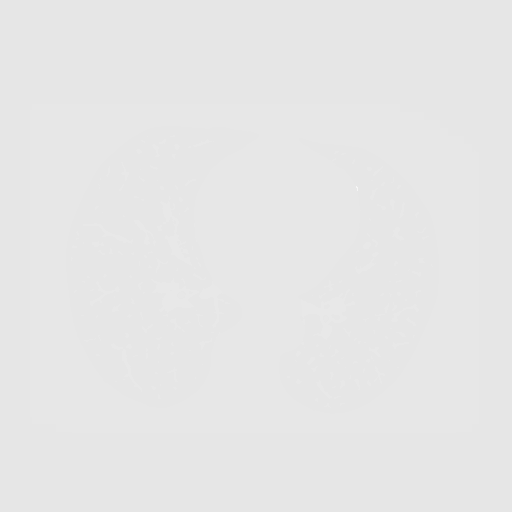
[frame 153/275  lung]
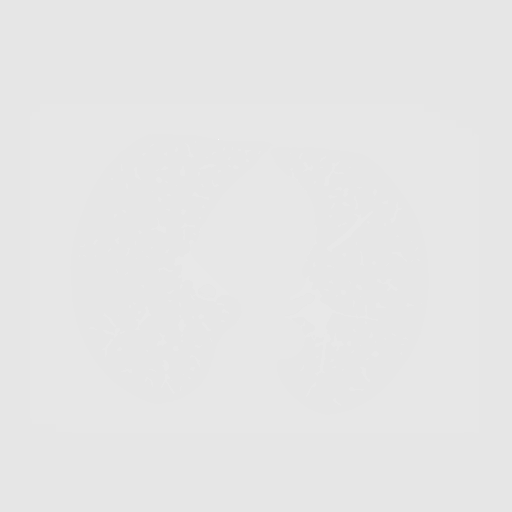
[frame 183/275  lung]
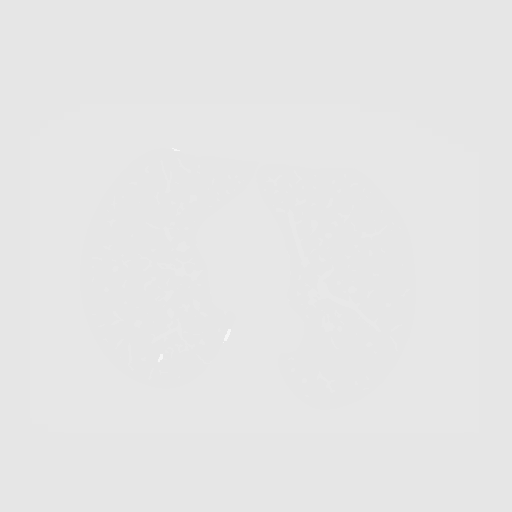
[frame 214/275  lung]
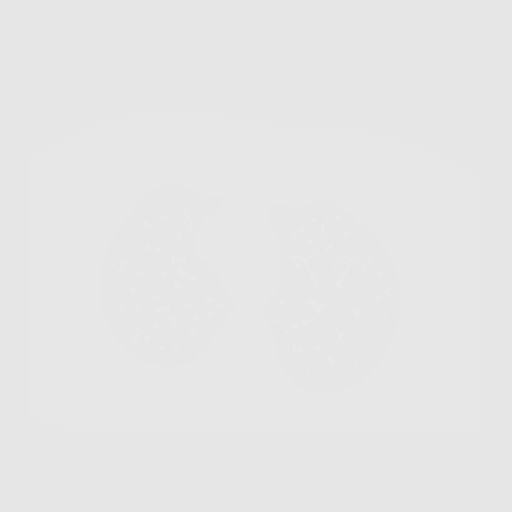
[frame 244/275  mediastinal]
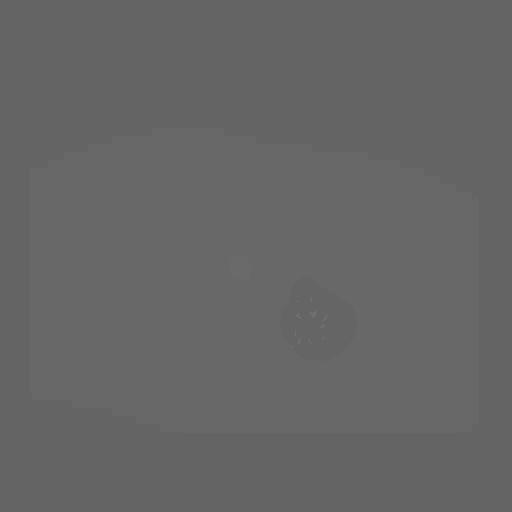
[frame 244/275  lung]
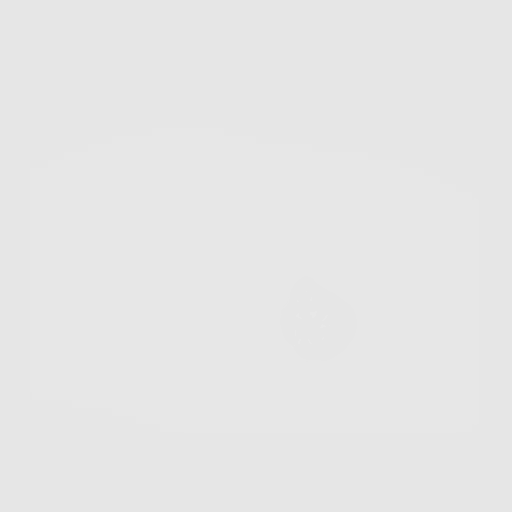
[frame 275/275  lung]
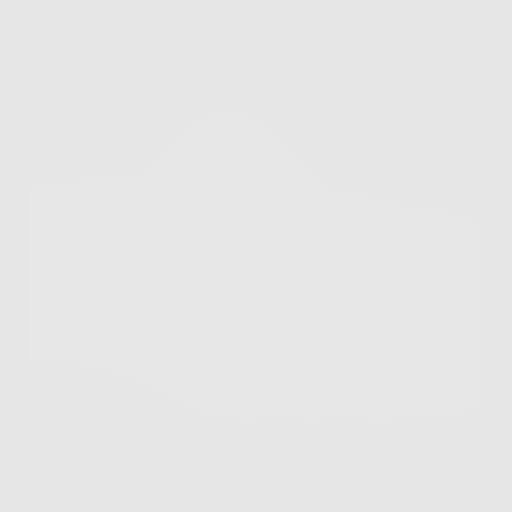

[10 of 20 positions shown; findings below may reference images not displayed]

FINDINGS: Cardiovascular: Heart size appears within normal limits. No
pericardial effusion. Aortic atherosclerosis. Coronary artery
calcifications.

Mediastinum/Nodes: No enlarged mediastinal, hilar, or axillary lymph
nodes. Thyroid gland, trachea, and esophagus demonstrate no
significant findings.

Lungs/Pleura: No pleural effusion, airspace consolidation, or
atelectasis. Focal area of scarring and architectural distortion
noted within the posteromedial right upper lobe, image 89/3. Similar
to previous exam. Several tiny solid nodules are also identified,
the largest of which is in the posterolateral right upper lobe with
an equivalent diameter of 1.4 mm.

Upper Abdomen: No acute findings within the imaged portions of the
upper abdomen. Aortic atherosclerosis. Calcified gallstones
identified within the neck of gallbladder.

Musculoskeletal: Mild thoracolumbar scoliosis. Multilevel
degenerative disc disease identified.
IMPRESSION: 1. Lung-RADS 2, benign appearance or behavior. Continue annual
screening with low-dose chest CT without contrast in 12 months.
2. Coronary artery calcifications noted.
3. Gallstones.
4. Aortic atherosclerosis.

Aortic Atherosclerosis (NPAX1-PI4.4).

## 2022-11-18 DIAGNOSIS — J339 Nasal polyp, unspecified: Secondary | ICD-10-CM | POA: Diagnosis not present

## 2022-11-18 DIAGNOSIS — J324 Chronic pansinusitis: Secondary | ICD-10-CM | POA: Diagnosis not present

## 2023-01-06 DIAGNOSIS — J069 Acute upper respiratory infection, unspecified: Secondary | ICD-10-CM | POA: Diagnosis not present

## 2023-01-06 DIAGNOSIS — Z0000 Encounter for general adult medical examination without abnormal findings: Secondary | ICD-10-CM | POA: Diagnosis not present

## 2023-01-27 DIAGNOSIS — J324 Chronic pansinusitis: Secondary | ICD-10-CM | POA: Diagnosis not present

## 2023-02-02 ENCOUNTER — Other Ambulatory Visit: Payer: Self-pay | Admitting: Acute Care

## 2023-02-02 DIAGNOSIS — Z122 Encounter for screening for malignant neoplasm of respiratory organs: Secondary | ICD-10-CM

## 2023-02-02 DIAGNOSIS — Z87891 Personal history of nicotine dependence: Secondary | ICD-10-CM

## 2023-02-04 DIAGNOSIS — D692 Other nonthrombocytopenic purpura: Secondary | ICD-10-CM | POA: Diagnosis not present

## 2023-02-18 ENCOUNTER — Other Ambulatory Visit: Payer: Medicare Other

## 2023-03-01 ENCOUNTER — Ambulatory Visit: Admission: RE | Admit: 2023-03-01 | Payer: Medicare Other | Source: Ambulatory Visit

## 2023-03-01 DIAGNOSIS — Z87891 Personal history of nicotine dependence: Secondary | ICD-10-CM

## 2023-03-01 DIAGNOSIS — Z122 Encounter for screening for malignant neoplasm of respiratory organs: Secondary | ICD-10-CM

## 2023-03-04 ENCOUNTER — Telehealth: Payer: Self-pay | Admitting: Acute Care

## 2023-03-04 NOTE — Telephone Encounter (Signed)
Tiffany calling w/call report for CT.  Please advise.  CB# [phone]

## 2023-03-04 NOTE — Telephone Encounter (Signed)
See other phone note

## 2023-03-04 NOTE — Telephone Encounter (Addendum)
Addendum, Patient has called the office. I have discussed the finding on her Low Dose CT as documented below. She is in agreement with coming in to see Dr. Tonia Brooms 7/29/at 9 am. Haskell Flirt ask one of my nurses to call her this after noon with the address. She was at lunch and could not make note of it. She will be home after 2:30 today.  Thanks so much   I have attempted to call the patient with the results of her low-dose screening scan.  There was no answer on either the home or mobile phone number.  I have left HIPAA compliant voice messages on both phones.  I have asked the patient to return the call so we can review the results of her recent lung cancer screening scan.  I have left our contact number on both voicemails. Patient scan was read as a lung RADS 4B.  There is an enlarging mixed solid and subsolid lesion in the posterior aspect of the right upper lobe that is highly concerning for a primary bronchogenic adenocarcinoma.  This is a poorly defined area of architectural distortion in the posterior aspect of the right upper lobe which has slowly evolved compared to prior exams, mixed solid and subsolid in appearance.  Overall the lesion measures approximately 3.7 x 3.3 cm.  There are several internal solid-appearing components measuring up to 15 mm in length which have become increasingly prominent compared to prior examinations.  There is also diffuse bronchial wall thickening with mild central lobar and paraseptal emphysema.  Dr. Tonia Brooms has an opening on his schedule Monday, 03/08/2023 at 9 AM.  Plan will be to place the patient in that slot to see Dr. Tonia Brooms on that date if she is available, once we do get in touch with her.  She will most likely need a PET scan based on Dr. Myrlene Broker evaluation.  We will continue to attempt to contact the patient with the results of her scan so that we can get her into see a provider.  Denise please fax results to PCP and let them know we are working on contacting the  patient to do follow-up in the office with Dr. Tonia Brooms.  Thanks so much

## 2023-03-04 NOTE — Telephone Encounter (Signed)
Results/plan faxed to PCP 

## 2023-03-07 NOTE — Progress Notes (Unsigned)
Synopsis: Referred in July 2024 for abnormal lung cancer screening CT by Jackelyn Poling, DO  Subjective:   PATIENT ID: Alyssa Rodriguez GENDER: female DOB: 11/11/1955, MRN: [id number]  No chief complaint on file.   This is a 67 year old female, history of tobacco abuse.  Currently enrolled in our lung cancer screening program.  Patient had a abnormal lung cancer screening CT completed on 03/04/2023 which revealed a enlarging mixed subsolid and solid lesion in the posterior aspect of the right upper lobe concerning for primary bronchogenic carcinoma.  Patient was referred for evaluation.  Has diffuse bronchial wall thickening areas of centrilobular and paraseptal emphysema consistent with COPD.  Also has incidentally found cholelithiasis.  Patient has an additional history of chronic pansinusitis as well as nasal polyps.     No past medical history on file.   No family history on file.   No past surgical history on file.  Social History   Socioeconomic History   Marital status: Widowed    Spouse name: Not on file   Number of children: Not on file   Years of education: Not on file   Highest education level: Not on file  Occupational History   Not on file  Tobacco Use   Smoking status: Former    Current packs/day: 0.00    Average packs/day: 2.0 packs/day for 30.0 years (60.0 ttl pk-yrs)    Types: Cigarettes    Start date: 04/23/1987    Quit date: 04/22/2017    Years since quitting: 5.8   Smokeless tobacco: Never  Substance and Sexual Activity   Alcohol use: Not on file   Drug use: Not on file   Sexual activity: Not on file  Other Topics Concern   Not on file  Social History Narrative   Not on file   Social Determinants of Health   Financial Resource Strain: Not on file  Food Insecurity: Not on file  Transportation Needs: Not on file  Physical Activity: Not on file  Stress: Not on file  Social Connections: Not on file  Intimate Partner Violence: Not on file      Not on File   No outpatient medications prior to visit.   No facility-administered medications prior to visit.    ROS   Objective:  Physical Exam   There were no vitals filed for this visit.   on *** LPM *** RA BMI Readings from Last 3 Encounters:  No data found for BMI   Wt Readings from Last 3 Encounters:  No data found for Wt     CBC No results found for: "WBC", "RBC", "HGB", "HCT", "PLT", "MCV", "MCH", "MCHC", "RDW", "LYMPHSABS", "MONOABS", "EOSABS", "BASOSABS"    Chest Imaging: Lung cancer screening CT July 2024: Enlarging right upper lobe posterior subsolid lesion concerning for primary bronchogenic carcinoma evidence of centrilobular and paraseptal emphysema. The patient's images have been independently reviewed by me.    Pulmonary Functions Testing Results:     No data to display          FeNO: ***  Pathology: ***  Echocardiogram: ***  Heart Catheterization: ***    Assessment & Plan:   No diagnosis found.  Discussion: ***  No current outpatient medications on file.  I spent *** minutes dedicated to the care of this patient on the date of this encounter to include pre-visit review of records, face-to-face time with the patient discussing conditions above, post visit ordering of testing, clinical documentation with the electronic health record, making appropriate  referrals as documented, and communicating necessary findings to members of the patients care team.   Josephine Igo, DO [facility] 03/07/2023 2:16 PM

## 2023-03-08 ENCOUNTER — Encounter: Payer: Self-pay | Admitting: Pulmonary Disease

## 2023-03-08 ENCOUNTER — Ambulatory Visit: Payer: Medicare Other | Admitting: Pulmonary Disease

## 2023-03-08 VITALS — BP 130/70 | HR 61 | Ht 60.0 in | Wt 144.2 lb

## 2023-03-08 DIAGNOSIS — J432 Centrilobular emphysema: Secondary | ICD-10-CM

## 2023-03-08 DIAGNOSIS — R911 Solitary pulmonary nodule: Secondary | ICD-10-CM | POA: Diagnosis not present

## 2023-03-08 DIAGNOSIS — Z720 Tobacco use: Secondary | ICD-10-CM

## 2023-03-08 NOTE — Patient Instructions (Addendum)
Thank you for visiting Dr. Tonia Brooms at [facility]. Today we recommend the following:  Orders Placed This Encounter  Procedures   NM PET Image Initial (PI) Skull Base To Thigh (F-18 FDG)   Pulmonary Function Test   Return in about 4 weeks (around 04/05/2023) for with Kandice Robinsons, NP, after PFTs, after PET/CT Chest.    Please do your part to reduce the spread of COVID-19.

## 2023-03-12 ENCOUNTER — Ambulatory Visit (HOSPITAL_COMMUNITY)
Admission: RE | Admit: 2023-03-12 | Discharge: 2023-03-12 | Disposition: A | Discharge status: Home / Self Care | Payer: Medicare Other | Source: Ambulatory Visit | Attending: Pulmonary Disease | Admitting: Pulmonary Disease

## 2023-03-12 DIAGNOSIS — R911 Solitary pulmonary nodule: Secondary | ICD-10-CM | POA: Diagnosis not present

## 2023-03-12 DIAGNOSIS — R918 Other nonspecific abnormal finding of lung field: Secondary | ICD-10-CM | POA: Diagnosis not present

## 2023-03-12 LAB — GLUCOSE, CAPILLARY: Glucose-Capillary: 83 mg/dL (ref 70–99)

## 2023-03-12 MED ORDER — FLUDEOXYGLUCOSE F - 18 (FDG) INJECTION
7.2200 | Freq: Once | INTRAVENOUS | Status: AC
  Administered 2023-03-12: 7.22 via INTRAVENOUS

## 2023-03-22 NOTE — Progress Notes (Signed)
PET reviewed. Would consider moving appt to a virtual visit to discuss results and then we can free up a slot on SG schedule.   Thanks,  BLI  Alyssa Igo, DO [facility] 03/22/2023 5:18 PM

## 2023-04-09 DIAGNOSIS — Z1231 Encounter for screening mammogram for malignant neoplasm of breast: Secondary | ICD-10-CM | POA: Diagnosis not present

## 2023-04-13 ENCOUNTER — Encounter: Payer: Self-pay | Admitting: Acute Care

## 2023-04-13 ENCOUNTER — Telehealth (INDEPENDENT_AMBULATORY_CARE_PROVIDER_SITE_OTHER): Payer: Medicare Other | Admitting: Acute Care

## 2023-04-13 DIAGNOSIS — R911 Solitary pulmonary nodule: Secondary | ICD-10-CM | POA: Diagnosis not present

## 2023-04-13 DIAGNOSIS — Z87891 Personal history of nicotine dependence: Secondary | ICD-10-CM

## 2023-04-13 NOTE — Patient Instructions (Addendum)
It is great to see you today. You PET scan was not concerning for lung cancer. What we saw appears to be inflammation or scarring.  This is great news.  We will resume annual lung cancer screening 03/2024 Follow up with PCP regarding incidental findings of vascular disease. Call for any changes in breathing, blood in sputum when you cough or unexplained weight loss to call to be seen Please contact office for sooner follow up if symptoms do not improve or worsen or seek emergency care

## 2023-04-13 NOTE — Progress Notes (Signed)
Virtual Visit via Video Note  I connected with Alyssa Rodriguez on 04/13/23 at  8:30 AM EDT by a video enabled telemedicine application and verified that I am speaking with the correct person using two identifiers.  Location: Patient:  At home Provider:  [address], [place], [place], [address]    I discussed the limitations of evaluation and management by telemedicine and the availability of in person appointments. The patient expressed understanding and agreed to proceed.  History of Present Illness: 67 year old female, former history of tobacco abuse. ( Quit 6 years ago with a 60 pack year smoking history) Currently enrolled in our lung cancer screening program. Patient had a abnormal lung cancer screening CT completed on 03/04/2023 which revealed a enlarging mixed subsolid and solid lesion in the posterior aspect of the right upper lobe concerning for primary bronchogenic carcinoma. Patient was referred for evaluation. She had no respiratory issues at the time of evaluation. She was seen by Dr. Tonia Brooms 03/08/2023 who ordered a PET scan and PFT's. She presents today for follow up to review PET scan results. PET imaging showed no No hypermetabolism in the right upper lobe to suggest a neoplastic process. Radiology recommends resumption of routine lung cancer screening chest CT program in 03/2024. Patient is in agreement with this plan. We did discuss the incidental findings of Age advanced vascular disease, specifically aortic atherosclerosis. Additionally there was notation of Cholelithiasis. She is not on statin medication. I have asked her to follow up with her PCP. I reminded her if she has any changes in her breathing, hemoptysis or unexplained weight loss to call to be seen. She verbalized understanding.    Observations/Objective: 03/12/2023 PET scan  Stable ill-defined patchy ground-glass opacity, peribronchial thickening and interstitial thickening in the posterior aspect of the right  upper lobe. This is most likely an area of chronic inflammation or scarring. No hypermetabolism to suggest a neoplastic process. Recommend resumption of routine lung cancer screening chest CT program. 2. No hypermetabolic mediastinal or hilar nodes. 3. 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 4. Cholelithiasis.  Assessment and Plan: Lung Nodule in the posterior aspect of the right upper lobe, resulting in an abnormal lung cancer screening scan. 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 Aortic Atherosclerosis , Not on statin therapy Cholelithiasis.  Plan Resumption of routine lung cancer screening chest CT program in August 2025. Follow up with PCP regarding incidental findings of vascular disease  Follow Up Instructions: Annual lung cancer screening 03/2024.  As needed for any changes in her breathing, hemoptysis or unexplained weight loss    I discussed the assessment and treatment plan with the patient. The patient was provided an opportunity to ask questions and all were answered. The patient agreed with the plan and demonstrated an understanding of the instructions.   The patient was advised to call back or seek an in-person evaluation if the symptoms worsen or if the condition fails to improve as anticipated.  I spent 20 minutes dedicated to the care of this patient on the date of this encounter to include pre-visit review of records, face-to-face ( Video visit) time with the patient discussing conditions above, post visit ordering of testing, clinical documentation with the electronic health record, making appropriate referrals as documented, and communicating necessary information to the patient's healthcare team.   Bevelyn Ngo, NP  04/13/2023

## 2023-04-14 DIAGNOSIS — R5383 Other fatigue: Secondary | ICD-10-CM | POA: Diagnosis not present

## 2023-04-14 DIAGNOSIS — Z131 Encounter for screening for diabetes mellitus: Secondary | ICD-10-CM | POA: Diagnosis not present

## 2023-04-23 DIAGNOSIS — M1990 Unspecified osteoarthritis, unspecified site: Secondary | ICD-10-CM | POA: Diagnosis not present

## 2023-04-23 DIAGNOSIS — G479 Sleep disorder, unspecified: Secondary | ICD-10-CM | POA: Diagnosis not present

## 2023-04-23 DIAGNOSIS — J439 Emphysema, unspecified: Secondary | ICD-10-CM | POA: Diagnosis not present

## 2023-04-23 DIAGNOSIS — E559 Vitamin D deficiency, unspecified: Secondary | ICD-10-CM | POA: Diagnosis not present

## 2023-04-23 DIAGNOSIS — M810 Age-related osteoporosis without current pathological fracture: Secondary | ICD-10-CM | POA: Diagnosis not present

## 2023-04-23 DIAGNOSIS — Z87891 Personal history of nicotine dependence: Secondary | ICD-10-CM | POA: Diagnosis not present

## 2023-04-23 DIAGNOSIS — R5382 Chronic fatigue, unspecified: Secondary | ICD-10-CM | POA: Diagnosis not present

## 2023-04-28 DIAGNOSIS — J331 Polypoid sinus degeneration: Secondary | ICD-10-CM | POA: Diagnosis not present

## 2023-04-28 DIAGNOSIS — J339 Nasal polyp, unspecified: Secondary | ICD-10-CM | POA: Diagnosis not present

## 2023-05-05 DIAGNOSIS — I10 Essential (primary) hypertension: Secondary | ICD-10-CM | POA: Diagnosis not present

## 2023-05-13 DIAGNOSIS — E785 Hyperlipidemia, unspecified: Secondary | ICD-10-CM | POA: Diagnosis not present

## 2023-05-13 DIAGNOSIS — J324 Chronic pansinusitis: Secondary | ICD-10-CM | POA: Diagnosis not present

## 2023-05-13 DIAGNOSIS — J339 Nasal polyp, unspecified: Secondary | ICD-10-CM | POA: Diagnosis not present

## 2023-05-20 DIAGNOSIS — I10 Essential (primary) hypertension: Secondary | ICD-10-CM | POA: Diagnosis not present

## 2023-05-21 DIAGNOSIS — H26493 Other secondary cataract, bilateral: Secondary | ICD-10-CM | POA: Diagnosis not present

## 2023-05-27 DIAGNOSIS — I10 Essential (primary) hypertension: Secondary | ICD-10-CM | POA: Diagnosis not present

## 2023-06-03 DIAGNOSIS — E559 Vitamin D deficiency, unspecified: Secondary | ICD-10-CM | POA: Diagnosis not present

## 2023-06-10 DIAGNOSIS — R5382 Chronic fatigue, unspecified: Secondary | ICD-10-CM | POA: Diagnosis not present

## 2023-06-10 DIAGNOSIS — M1990 Unspecified osteoarthritis, unspecified site: Secondary | ICD-10-CM | POA: Diagnosis not present

## 2023-06-18 DIAGNOSIS — E785 Hyperlipidemia, unspecified: Secondary | ICD-10-CM | POA: Diagnosis not present

## 2023-07-02 DIAGNOSIS — E785 Hyperlipidemia, unspecified: Secondary | ICD-10-CM | POA: Diagnosis not present

## 2023-07-12 DIAGNOSIS — R5383 Other fatigue: Secondary | ICD-10-CM | POA: Diagnosis not present

## 2023-07-23 DIAGNOSIS — J0141 Acute recurrent pansinusitis: Secondary | ICD-10-CM | POA: Diagnosis not present

## 2023-07-23 DIAGNOSIS — H7292 Unspecified perforation of tympanic membrane, left ear: Secondary | ICD-10-CM | POA: Diagnosis not present

## 2023-07-28 DIAGNOSIS — M810 Age-related osteoporosis without current pathological fracture: Secondary | ICD-10-CM | POA: Diagnosis not present

## 2023-07-28 DIAGNOSIS — I739 Peripheral vascular disease, unspecified: Secondary | ICD-10-CM | POA: Diagnosis not present

## 2023-07-28 DIAGNOSIS — R918 Other nonspecific abnormal finding of lung field: Secondary | ICD-10-CM | POA: Diagnosis not present

## 2023-07-28 DIAGNOSIS — H7292 Unspecified perforation of tympanic membrane, left ear: Secondary | ICD-10-CM | POA: Diagnosis not present

## 2023-07-28 DIAGNOSIS — J439 Emphysema, unspecified: Secondary | ICD-10-CM | POA: Diagnosis not present

## 2023-07-28 DIAGNOSIS — I700 Atherosclerosis of aorta: Secondary | ICD-10-CM | POA: Diagnosis not present

## 2023-08-05 DIAGNOSIS — J324 Chronic pansinusitis: Secondary | ICD-10-CM | POA: Diagnosis not present

## 2023-08-05 DIAGNOSIS — H6122 Impacted cerumen, left ear: Secondary | ICD-10-CM | POA: Diagnosis not present

## 2023-08-05 DIAGNOSIS — J339 Nasal polyp, unspecified: Secondary | ICD-10-CM | POA: Diagnosis not present

## 2023-08-19 DIAGNOSIS — J329 Chronic sinusitis, unspecified: Secondary | ICD-10-CM | POA: Diagnosis not present

## 2023-08-19 DIAGNOSIS — R0981 Nasal congestion: Secondary | ICD-10-CM | POA: Diagnosis not present

## 2023-08-25 ENCOUNTER — Other Ambulatory Visit: Payer: Self-pay | Admitting: *Deleted

## 2023-08-25 DIAGNOSIS — Z122 Encounter for screening for malignant neoplasm of respiratory organs: Secondary | ICD-10-CM

## 2023-08-25 DIAGNOSIS — Z87891 Personal history of nicotine dependence: Secondary | ICD-10-CM

## 2023-10-17 DIAGNOSIS — R062 Wheezing: Secondary | ICD-10-CM | POA: Diagnosis not present

## 2023-10-17 DIAGNOSIS — R051 Acute cough: Secondary | ICD-10-CM | POA: Diagnosis not present

## 2023-10-31 DIAGNOSIS — J0100 Acute maxillary sinusitis, unspecified: Secondary | ICD-10-CM | POA: Diagnosis not present

## 2023-10-31 DIAGNOSIS — R0600 Dyspnea, unspecified: Secondary | ICD-10-CM | POA: Diagnosis not present

## 2023-10-31 DIAGNOSIS — R0602 Shortness of breath: Secondary | ICD-10-CM | POA: Diagnosis not present

## 2023-11-27 DIAGNOSIS — J324 Chronic pansinusitis: Secondary | ICD-10-CM | POA: Diagnosis not present

## 2023-12-31 DIAGNOSIS — R051 Acute cough: Secondary | ICD-10-CM | POA: Diagnosis not present

## 2023-12-31 DIAGNOSIS — R062 Wheezing: Secondary | ICD-10-CM | POA: Diagnosis not present

## 2024-01-08 DIAGNOSIS — M810 Age-related osteoporosis without current pathological fracture: Secondary | ICD-10-CM | POA: Diagnosis not present

## 2024-01-08 DIAGNOSIS — J439 Emphysema, unspecified: Secondary | ICD-10-CM | POA: Diagnosis not present

## 2024-02-07 DIAGNOSIS — J439 Emphysema, unspecified: Secondary | ICD-10-CM | POA: Diagnosis not present

## 2024-02-07 DIAGNOSIS — M810 Age-related osteoporosis without current pathological fracture: Secondary | ICD-10-CM | POA: Diagnosis not present

## 2024-02-15 DIAGNOSIS — E785 Hyperlipidemia, unspecified: Secondary | ICD-10-CM | POA: Diagnosis not present

## 2024-02-15 DIAGNOSIS — Z0000 Encounter for general adult medical examination without abnormal findings: Secondary | ICD-10-CM | POA: Diagnosis not present

## 2024-02-15 DIAGNOSIS — R918 Other nonspecific abnormal finding of lung field: Secondary | ICD-10-CM | POA: Diagnosis not present

## 2024-02-15 DIAGNOSIS — J439 Emphysema, unspecified: Secondary | ICD-10-CM | POA: Diagnosis not present

## 2024-02-15 DIAGNOSIS — J019 Acute sinusitis, unspecified: Secondary | ICD-10-CM | POA: Diagnosis not present

## 2024-02-15 DIAGNOSIS — R7303 Prediabetes: Secondary | ICD-10-CM | POA: Diagnosis not present

## 2024-02-15 DIAGNOSIS — I739 Peripheral vascular disease, unspecified: Secondary | ICD-10-CM | POA: Diagnosis not present

## 2024-02-15 DIAGNOSIS — M810 Age-related osteoporosis without current pathological fracture: Secondary | ICD-10-CM | POA: Diagnosis not present

## 2024-02-18 DIAGNOSIS — R053 Chronic cough: Secondary | ICD-10-CM | POA: Diagnosis not present

## 2024-02-18 DIAGNOSIS — J324 Chronic pansinusitis: Secondary | ICD-10-CM | POA: Diagnosis not present

## 2024-02-18 DIAGNOSIS — R911 Solitary pulmonary nodule: Secondary | ICD-10-CM | POA: Diagnosis not present

## 2024-02-18 DIAGNOSIS — J441 Chronic obstructive pulmonary disease with (acute) exacerbation: Secondary | ICD-10-CM | POA: Diagnosis not present

## 2024-02-18 DIAGNOSIS — J4551 Severe persistent asthma with (acute) exacerbation: Secondary | ICD-10-CM | POA: Diagnosis not present

## 2024-02-18 DIAGNOSIS — Z87891 Personal history of nicotine dependence: Secondary | ICD-10-CM | POA: Diagnosis not present

## 2024-02-18 DIAGNOSIS — J339 Nasal polyp, unspecified: Secondary | ICD-10-CM | POA: Diagnosis not present

## 2024-03-02 DIAGNOSIS — D721 Eosinophilia, unspecified: Secondary | ICD-10-CM | POA: Diagnosis not present

## 2024-03-09 DIAGNOSIS — Z87891 Personal history of nicotine dependence: Secondary | ICD-10-CM | POA: Diagnosis not present

## 2024-03-09 DIAGNOSIS — J984 Other disorders of lung: Secondary | ICD-10-CM | POA: Diagnosis not present

## 2024-03-09 DIAGNOSIS — R918 Other nonspecific abnormal finding of lung field: Secondary | ICD-10-CM | POA: Diagnosis not present

## 2024-03-09 DIAGNOSIS — M810 Age-related osteoporosis without current pathological fracture: Secondary | ICD-10-CM | POA: Diagnosis not present

## 2024-03-09 DIAGNOSIS — J439 Emphysema, unspecified: Secondary | ICD-10-CM | POA: Diagnosis not present

## 2024-03-10 DIAGNOSIS — K439 Ventral hernia without obstruction or gangrene: Secondary | ICD-10-CM | POA: Diagnosis not present

## 2024-03-10 DIAGNOSIS — J0191 Acute recurrent sinusitis, unspecified: Secondary | ICD-10-CM | POA: Diagnosis not present

## 2024-03-16 ENCOUNTER — Encounter: Payer: Self-pay | Admitting: Acute Care

## 2024-03-22 DIAGNOSIS — D72119 Hypereosinophilic syndrome (hes), unspecified: Secondary | ICD-10-CM | POA: Diagnosis not present

## 2024-03-22 DIAGNOSIS — J329 Chronic sinusitis, unspecified: Secondary | ICD-10-CM | POA: Diagnosis not present

## 2024-03-22 DIAGNOSIS — J4550 Severe persistent asthma, uncomplicated: Secondary | ICD-10-CM | POA: Diagnosis not present

## 2024-03-29 ENCOUNTER — Ambulatory Visit: Payer: Self-pay | Admitting: Surgery

## 2024-03-29 DIAGNOSIS — K439 Ventral hernia without obstruction or gangrene: Secondary | ICD-10-CM | POA: Diagnosis not present

## 2024-03-29 NOTE — H&P (Signed)
 History of Present Illness: Alyssa Rodriguez is a 68 y.o. female who is seen today as an office consultation for evaluation of New Consultation (Ventral hernia)   She has known about the hernia for a while, but says it only started bothering her in the last month after she was moving some furniture. The pain has improved since then. She has not had obstructive symptoms.   She has asthma and emphysema and follows with pulmonology at [facility]. She recently had PFTs on 7/31, which per the recent pulm note showed mild restriction. She is former smoker and quit in 2017. Her only prior abdominal surgeries are two C sections.     Review of Systems: A complete review of systems was obtained from the patient.  I have reviewed this information and discussed as appropriate with the patient.  See HPI as well for other ROS.     Medical History: Past Medical History Past Medical History: Diagnosis Date  Tobacco use         Problem List There is no problem list on file for this patient.     Past Surgical History Past Surgical History: Procedure Laterality Date  .c-section          Allergies No Known Allergies    Medications Ordered Prior to Encounter Current Outpatient Medications on File Prior to Visit Medication Sig Dispense Refill  albuterol  MDI, PROVENTIL , VENTOLIN , PROAIR , HFA 90 mcg/actuation inhaler Inhale 2 inhalations into the lungs every 6 (six) hours as needed      alendronate (FOSAMAX) 70 MG tablet Take 70 mg by mouth every 7 (seven) days      amLODIPine (NORVASC) 10 MG tablet Take 10 mg by mouth once daily      amLODIPine-atorvastatin (CADUET) 10-10 mg tablet Take 1 tablet by mouth once daily      ascorbic acid, vitamin C, (VITAMIN C) 500 MG tablet Take 500 mg by mouth once daily      atorvastatin (LIPITOR) 20 MG tablet Take 20 mg by mouth at bedtime      azelastine (ASTELIN) 137 mcg nasal spray Place 1 spray into both nostrils      buPROPion HCL, smoking deter, 150 mg  Tb12 Take 150 mg by mouth      losartan (COZAAR) 100 MG tablet Take 100 mg by mouth once daily      meloxicam (MOBIC) 15 MG tablet Take 15 mg by mouth      WIXELA INHUB 500-50 mcg/dose diskus inhaler INHALE 1 PUFF IN THE MORNING AND BEFORE BEDTIME        No current facility-administered medications on file prior to visit.      Family History Family History Problem Relation Age of Onset  Heart valve disease Mother    High blood pressure (Hypertension) Mother    Hyperlipidemia (Elevated cholesterol) Mother    Heart valve disease Father    High blood pressure (Hypertension) Father    Hyperlipidemia (Elevated cholesterol) Father        Tobacco Use History Social History    Tobacco Use Smoking Status Former  Types: Cigarettes Smokeless Tobacco Never      Social History Social History    Socioeconomic History  Marital status: Widowed Tobacco Use  Smoking status: Former     Types: Cigarettes  Smokeless tobacco: Never Vaping Use  Vaping status: Unknown Substance and Sexual Activity  Alcohol use: Yes     Alcohol/week: 0.0 - 1.0 standard drinks of alcohol  Drug use: Never    Social  Drivers of Health    Food Insecurity: Low Risk  (03/22/2024)   Received from [facility]   Hunger Vital Sign    Within the past 12 months, you worried that your food would run out before you got money to buy more: Never true    Within the past 12 months, the food you bought just didn't last and you didn't have money to get more. : Never true Transportation Needs: No Transportation Needs (03/22/2024)   Received from [facility]    In the past 12 months, has lack of reliable transportation kept you from medical appointments, meetings, work or from getting things needed for daily living? : No Housing Stability: Unknown (03/29/2024)   Housing Stability Vital Sign    Homeless in the Last Year: No      Objective:   Vitals:   03/29/24 0850 BP: (!)  158/95 Pulse: 76 Resp: 16 Temp: 37 C (98.6 F) SpO2: 98% Weight: 64.7 kg (142 lb 9.6 oz) Height: 152.4 cm (5') PainSc: 0-No pain   Body mass index is 27.85 kg/m.   Physical Exam Vitals reviewed.  Constitutional:      General: She is not in acute distress.    Appearance: Normal appearance.  HENT:     Head: Normocephalic and atraumatic.  Eyes:     General: No scleral icterus.    Conjunctiva/sclera: Conjunctivae normal.  Cardiovascular:     Rate and Rhythm: Normal rate and regular rhythm.     Heart sounds: No murmur heard. Pulmonary:     Effort: Pulmonary effort is normal. No respiratory distress.     Breath sounds: Normal breath sounds.  Abdominal:     General: There is no distension.     Palpations: Abdomen is soft.     Tenderness: There is no abdominal tenderness.     Comments: Hernia in the epigastric area at midline is nonreducible in the supine position, contents are about 3-4cm in diameter. No surgical scars in the upper abdomen.  Skin:    General: Skin is warm and dry.     Coloration: Skin is not jaundiced.  Neurological:     General: No focal deficit present.     Mental Status: She is alert and oriented to person, place, and time.           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 I reviewed a PET she had done last year for evaluation of pulmonary nodules, which showed a very small fat-containing epigastric hernia. I reviewed the details of an open hernia repair with the possibility of mesh placement depending on the size of the hernia defect. I reviewed the benefits and the risks of bleeding, mesh infection, and hernia recurrence. She expressed understanding and consents to proceed. She will be contacted to schedule an elective surgery date.   Leonor Dawn, MD  [facility] Surgery General, Hepatobiliary and Pancreatic Surgery 03/29/24 11:34 AM

## 2024-04-09 DIAGNOSIS — M810 Age-related osteoporosis without current pathological fracture: Secondary | ICD-10-CM | POA: Diagnosis not present

## 2024-04-09 DIAGNOSIS — J439 Emphysema, unspecified: Secondary | ICD-10-CM | POA: Diagnosis not present

## 2024-04-14 DIAGNOSIS — M810 Age-related osteoporosis without current pathological fracture: Secondary | ICD-10-CM | POA: Diagnosis not present

## 2024-04-14 DIAGNOSIS — Z1231 Encounter for screening mammogram for malignant neoplasm of breast: Secondary | ICD-10-CM | POA: Diagnosis not present

## 2024-04-18 ENCOUNTER — Encounter (HOSPITAL_COMMUNITY): Payer: Self-pay

## 2024-04-18 NOTE — Progress Notes (Signed)
 Surgical Instructions   Your procedure is scheduled on Monday, September 15th, 2025. Report to [facility] Main Entrance A at 5:30 A.M., then check in with the Admitting office. Any questions or running late day of surgery: call [phone]  Questions prior to your surgery date: call [phone], Monday-Friday, 8am-4pm. If you experience any cold or flu symptoms such as cough, fever, chills, shortness of breath, etc. between now and your scheduled surgery, please notify us  at the above number.     Remember:  Do not eat after midnight the night before your surgery   You may drink clear liquids until 4:30 the morning of your surgery.   Clear liquids allowed are: Water, Non-Citrus Juices (without pulp), Carbonated Beverages, Clear Tea (no milk, honey, etc.), Black Coffee Only (NO MILK, CREAM OR POWDERED CREAMER of any kind), and Gatorade.    Take these medicines the morning of surgery with A SIP OF WATER: Amlodipine (Norvasc) Atorvastatin (Lipitor) Azelastine (Astelin) Nasal Spray Wixela Inhaler   May take these medicines IF NEEDED: Albuterol  (Ventolin  HFA) inhaler - bring with you on day of surgery    One week prior to surgery, STOP taking any Aspirin (unless otherwise instructed by your surgeon) Aleve, Naproxen, Ibuprofen, Motrin, Advil, Goody's, BC's, all herbal medications, fish oil, and non-prescription vitamins.  This includes Meloxicam (Mobic).                      Do NOT Smoke (Tobacco/Vaping) for 24 hours prior to your procedure.  If you use a CPAP at night, you may bring your mask/headgear for your overnight stay.   You will be asked to remove any contacts, glasses, piercing's, hearing aid's, dentures/partials prior to surgery. Please bring cases for these items if needed.    Patients discharged the day of surgery will not be allowed to drive home, and someone needs to stay with them for 24 hours.  SURGICAL WAITING ROOM VISITATION Patients may have no more than 2  support people in the waiting area - these visitors may rotate.   Pre-op nurse will coordinate an appropriate time for 1 ADULT support person, who may not rotate, to accompany patient in pre-op.  Children under the age of 59 must have an adult with them who is not the patient and must remain in the main waiting area with an adult.  If the patient needs to stay at the hospital during part of their recovery, the visitor guidelines for inpatient rooms apply.  Please refer to the [facility] website for the visitor guidelines for any additional information.   If you received a COVID test during your pre-op visit  it is requested that you wear a mask when out in public, stay away from anyone that may not be feeling well and notify your surgeon if you develop symptoms. If you have been in contact with anyone that has tested positive in the last 10 days please notify you surgeon.      Pre-operative CHG Bathing Instructions   You can play a key role in reducing the risk of infection after surgery. Your skin needs to be as free of germs as possible. You can reduce the number of germs on your skin by washing with CHG (chlorhexidine gluconate) soap before surgery. CHG is an antiseptic soap that kills germs and continues to kill germs even after washing.   DO NOT use if you have an allergy to chlorhexidine/CHG or antibacterial soaps. If your skin becomes reddened or irritated, stop  using the CHG and notify one of our RNs at [phone].              TAKE A SHOWER THE NIGHT BEFORE SURGERY AND THE DAY OF SURGERY    Please keep in mind the following:  DO NOT shave, including legs and underarms, 48 hours prior to surgery.   You may shave your face before/day of surgery.  Place clean sheets on your bed the night before surgery Use a clean washcloth (not used since being washed) for each shower. DO NOT sleep with pet's night before surgery.  CHG Shower Instructions:  Wash your face and private area with  normal soap. If you choose to wash your hair, wash first with your normal shampoo.  After you use shampoo/soap, rinse your hair and body thoroughly to remove shampoo/soap residue.  Turn the water OFF and apply half the bottle of CHG soap to a CLEAN washcloth.  Apply CHG soap ONLY FROM YOUR NECK DOWN TO YOUR TOES (washing for 3-5 minutes)  DO NOT use CHG soap on face, private areas, open wounds, or sores.  Pay special attention to the area where your surgery is being performed.  If you are having back surgery, having someone wash your back for you may be helpful. Wait 2 minutes after CHG soap is applied, then you may rinse off the CHG soap.  Pat dry with a clean towel  Put on clean pajamas    Additional instructions for the day of surgery: DO NOT APPLY any lotions, deodorants, cologne, or perfumes.   Do not wear jewelry or makeup Do not wear nail polish, gel polish, artificial nails, or any other type of covering on natural nails (fingers and toes) Do not bring valuables to the hospital. [facility] is not responsible for valuables/personal belongings. Put on clean/comfortable clothes.  Please brush your teeth.  Ask your nurse before applying any prescription medications to the skin.

## 2024-04-19 ENCOUNTER — Encounter (HOSPITAL_COMMUNITY): Payer: Self-pay

## 2024-04-19 ENCOUNTER — Encounter (HOSPITAL_COMMUNITY)
Admission: RE | Admit: 2024-04-19 | Discharge: 2024-04-19 | Disposition: A | Discharge status: Home / Self Care | Source: Ambulatory Visit | Attending: Surgery | Admitting: Surgery

## 2024-04-19 ENCOUNTER — Other Ambulatory Visit: Payer: Self-pay

## 2024-04-19 VITALS — BP 133/74 | HR 69 | Temp 98.7°F | Resp 17 | Ht 60.0 in | Wt 141.7 lb

## 2024-04-19 DIAGNOSIS — R9431 Abnormal electrocardiogram [ECG] [EKG]: Secondary | ICD-10-CM | POA: Insufficient documentation

## 2024-04-19 DIAGNOSIS — Z01818 Encounter for other preprocedural examination: Secondary | ICD-10-CM | POA: Insufficient documentation

## 2024-04-19 HISTORY — DX: Hyperlipidemia, unspecified: E78.5

## 2024-04-19 HISTORY — DX: Prediabetes: R73.03

## 2024-04-19 HISTORY — DX: Emphysema, unspecified: J43.9

## 2024-04-19 HISTORY — DX: Family history of other specified conditions: Z84.89

## 2024-04-19 HISTORY — DX: Chronic sinusitis, unspecified: J32.9

## 2024-04-19 HISTORY — DX: Peripheral vascular disease, unspecified: I73.9

## 2024-04-19 HISTORY — DX: Other nonspecific abnormal finding of lung field: R91.8

## 2024-04-19 HISTORY — DX: Unspecified osteoarthritis, unspecified site: M19.90

## 2024-04-19 HISTORY — DX: Age-related osteoporosis without current pathological fracture: M81.0

## 2024-04-19 HISTORY — DX: Essential (primary) hypertension: I10

## 2024-04-19 LAB — BASIC METABOLIC PANEL WITH GFR
Anion gap: 5 (ref 5–15)
BUN: 18 mg/dL (ref 8–23)
CO2: 25 mmol/L (ref 22–32)
Calcium: 8.9 mg/dL (ref 8.9–10.3)
Chloride: 109 mmol/L (ref 98–111)
Creatinine, Ser: 0.68 mg/dL (ref 0.44–1.00)
GFR, Estimated: >60 mL/min (ref >60)
Glucose, Bld: 104 mg/dL — ABNORMAL HIGH (ref 70–99)
Potassium: 4.2 mmol/L (ref 3.5–5.1)
Sodium: 139 mmol/L (ref 135–145)

## 2024-04-19 LAB — CBC
HCT: 41.5 % (ref 36.0–46.0)
Hemoglobin: 13.6 g/dL (ref 12.0–15.0)
MCH: 30.6 pg (ref 26.0–34.0)
MCHC: 32.8 g/dL (ref 30.0–36.0)
MCV: 93.3 fL (ref 80.0–100.0)
Platelets: 362 K/uL (ref 150–400)
RBC: 4.45 MIL/uL (ref 3.87–5.11)
RDW: 12.8 % (ref 11.5–15.5)
WBC: 9.4 K/uL (ref 4.0–10.5)
nRBC: 0 % (ref 0.0–0.2)

## 2024-04-19 NOTE — Progress Notes (Signed)
 PCP - [facility]  Cardiologist -   PPM/ICD - denies Device Orders - n/a Rep Notified - n/a  Chest x-ray - 10-17-23 (CE note date 10-30-21) EKG - DOS Stress Test - denies ECHO - denies Cardiac Cath - denies  Sleep Study - denies CPAP - n/a  DM -denies  Blood Thinner Instructions:denies Aspirin Instructions:denies  ERAS Protcol -clear liquids until 8:00 am PRE-SURGERY Ensure or G2-   COVID TEST-    Anesthesia review: no  Patient denies shortness of breath, fever, cough and chest pain at PAT appointment   All instructions explained to the patient, with a verbal understanding of the material. Patient agrees to go over the instructions while at home for a better understanding. Patient also instructed to self quarantine after being tested for COVID-19. The opportunity to ask questions was provided.

## 2024-04-20 NOTE — Telephone Encounter (Signed)
 Patient called wanted to let the provider know that she is going to have a hernia surgery 04/24/24 at 11am dr Dasie. She was not sure if there is anything that needs to be done before.  Please advise [phone]

## 2024-04-20 NOTE — Telephone Encounter (Signed)
 Spoke with patient. I let her know if they need any forms filled out to clear her for surgery they can fax it to our office. I provided her with our fax number and she will reach out to surgeons office today. NFN

## 2024-04-24 ENCOUNTER — Ambulatory Visit (HOSPITAL_COMMUNITY): Admitting: Certified Registered Nurse Anesthetist

## 2024-04-24 ENCOUNTER — Ambulatory Visit (HOSPITAL_BASED_OUTPATIENT_CLINIC_OR_DEPARTMENT_OTHER): Admitting: Certified Registered Nurse Anesthetist

## 2024-04-24 ENCOUNTER — Ambulatory Visit (HOSPITAL_COMMUNITY)
Admission: RE | Admit: 2024-04-24 | Discharge: 2024-04-24 | Disposition: A | Discharge status: Home / Self Care | Attending: Surgery | Admitting: Surgery

## 2024-04-24 ENCOUNTER — Encounter (HOSPITAL_COMMUNITY)
Admission: RE | Disposition: A | Discharge status: Home / Self Care | Payer: Self-pay | Source: Home / Self Care | Attending: Surgery

## 2024-04-24 ENCOUNTER — Encounter (HOSPITAL_COMMUNITY): Payer: Self-pay | Admitting: Surgery

## 2024-04-24 ENCOUNTER — Other Ambulatory Visit: Payer: Self-pay

## 2024-04-24 DIAGNOSIS — J449 Chronic obstructive pulmonary disease, unspecified: Secondary | ICD-10-CM | POA: Diagnosis not present

## 2024-04-24 DIAGNOSIS — Z79899 Other long term (current) drug therapy: Secondary | ICD-10-CM | POA: Insufficient documentation

## 2024-04-24 DIAGNOSIS — E119 Type 2 diabetes mellitus without complications: Secondary | ICD-10-CM | POA: Diagnosis not present

## 2024-04-24 DIAGNOSIS — Z7951 Long term (current) use of inhaled steroids: Secondary | ICD-10-CM | POA: Diagnosis not present

## 2024-04-24 DIAGNOSIS — K439 Ventral hernia without obstruction or gangrene: Secondary | ICD-10-CM | POA: Diagnosis not present

## 2024-04-24 DIAGNOSIS — I739 Peripheral vascular disease, unspecified: Secondary | ICD-10-CM | POA: Insufficient documentation

## 2024-04-24 DIAGNOSIS — I10 Essential (primary) hypertension: Secondary | ICD-10-CM

## 2024-04-24 DIAGNOSIS — M1990 Unspecified osteoarthritis, unspecified site: Secondary | ICD-10-CM | POA: Insufficient documentation

## 2024-04-24 DIAGNOSIS — Z87891 Personal history of nicotine dependence: Secondary | ICD-10-CM

## 2024-04-24 HISTORY — PX: EPIGASTRIC HERNIA REPAIR: SHX404

## 2024-04-24 SURGERY — REPAIR, HERNIA, EPIGASTRIC, ADULT
Anesthesia: General

## 2024-04-24 MED ORDER — FENTANYL CITRATE (PF) 250 MCG/5ML IJ SOLN
INTRAMUSCULAR | Status: AC
  Filled 2024-04-24: qty 5

## 2024-04-24 MED ORDER — CEFAZOLIN SODIUM-DEXTROSE 2-4 GM/100ML-% IV SOLN
2.0000 g | INTRAVENOUS | Status: AC
  Administered 2024-04-24: 2 g via INTRAVENOUS

## 2024-04-24 MED ORDER — OXYCODONE HCL 5 MG PO TABS: 5.0000 mg | ORAL_TABLET | Freq: Once | ORAL | Status: DC | PRN

## 2024-04-24 MED ORDER — CELECOXIB 200 MG PO CAPS
ORAL_CAPSULE | ORAL | Status: AC
  Administered 2024-04-24: 200 mg via ORAL
  Filled 2024-04-24: qty 1

## 2024-04-24 MED ORDER — 0.9 % SODIUM CHLORIDE (POUR BTL) OPTIME
TOPICAL | Status: DC | PRN
  Administered 2024-04-24: 1000 mL

## 2024-04-24 MED ORDER — ROCURONIUM BROMIDE 10 MG/ML (PF) SYRINGE
PREFILLED_SYRINGE | INTRAVENOUS | Status: DC | PRN
  Administered 2024-04-24: 50 mg via INTRAVENOUS

## 2024-04-24 MED ORDER — DEXAMETHASONE SODIUM PHOSPHATE 10 MG/ML IJ SOLN
INTRAMUSCULAR | Status: AC
  Filled 2024-04-24: qty 1

## 2024-04-24 MED ORDER — OXYCODONE HCL 5 MG/5ML PO SOLN: 5.0000 mg | Freq: Once | ORAL | Status: DC | PRN

## 2024-04-24 MED ORDER — PROPOFOL 10 MG/ML IV BOLUS
INTRAVENOUS | Status: DC | PRN
Start: 2024-04-24 — End: 2024-04-24
  Administered 2024-04-24: 130 mg via INTRAVENOUS

## 2024-04-24 MED ORDER — FENTANYL CITRATE (PF) 100 MCG/2ML IJ SOLN: 25.0000 ug | INTRAMUSCULAR | Status: DC | PRN

## 2024-04-24 MED ORDER — PHENYLEPHRINE 80 MCG/ML (10ML) SYRINGE FOR IV PUSH (FOR BLOOD PRESSURE SUPPORT)
PREFILLED_SYRINGE | INTRAVENOUS | Status: DC | PRN
  Administered 2024-04-24 (×2): 80 ug via INTRAVENOUS

## 2024-04-24 MED ORDER — FENTANYL CITRATE (PF) 250 MCG/5ML IJ SOLN
INTRAMUSCULAR | Status: DC | PRN
  Administered 2024-04-24: 100 ug via INTRAVENOUS
  Administered 2024-04-24: 50 ug via INTRAVENOUS

## 2024-04-24 MED ORDER — LIDOCAINE 2% (20 MG/ML) 5 ML SYRINGE
INTRAMUSCULAR | Status: AC
  Filled 2024-04-24: qty 5

## 2024-04-24 MED ORDER — ACETAMINOPHEN 500 MG PO TABS: 1000.0000 mg | ORAL_TABLET | ORAL | Status: AC

## 2024-04-24 MED ORDER — BUPIVACAINE-EPINEPHRINE (PF) 0.25% -1:200000 IJ SOLN
INTRAMUSCULAR | Status: DC | PRN
  Administered 2024-04-24: 30 mL

## 2024-04-24 MED ORDER — LIDOCAINE 2% (20 MG/ML) 5 ML SYRINGE
INTRAMUSCULAR | Status: DC | PRN
  Administered 2024-04-24: 60 mg via INTRAVENOUS

## 2024-04-24 MED ORDER — ORAL CARE MOUTH RINSE: 15.0000 mL | Freq: Once | OROMUCOSAL | Status: AC

## 2024-04-24 MED ORDER — HYDROCODONE-ACETAMINOPHEN 5-325 MG PO TABS: 1.0000 | ORAL_TABLET | Freq: Four times a day (QID) | ORAL | 0 refills | Status: AC | PRN

## 2024-04-24 MED ORDER — MIDAZOLAM HCL 2 MG/2ML IJ SOLN
INTRAMUSCULAR | Status: AC
  Filled 2024-04-24: qty 2

## 2024-04-24 MED ORDER — ROCURONIUM BROMIDE 10 MG/ML (PF) SYRINGE
PREFILLED_SYRINGE | INTRAVENOUS | Status: AC
  Filled 2024-04-24: qty 10

## 2024-04-24 MED ORDER — LACTATED RINGERS IV SOLN
INTRAVENOUS | Status: DC
Start: 2024-04-24 — End: 2024-04-24

## 2024-04-24 MED ORDER — CELECOXIB 200 MG PO CAPS: 200.0000 mg | ORAL_CAPSULE | ORAL | Status: AC

## 2024-04-24 MED ORDER — PHENYLEPHRINE 80 MCG/ML (10ML) SYRINGE FOR IV PUSH (FOR BLOOD PRESSURE SUPPORT)
PREFILLED_SYRINGE | INTRAVENOUS | Status: AC
  Filled 2024-04-24: qty 10

## 2024-04-24 MED ORDER — BUPIVACAINE-EPINEPHRINE (PF) 0.25% -1:200000 IJ SOLN
INTRAMUSCULAR | Status: AC
  Filled 2024-04-24: qty 30

## 2024-04-24 MED ORDER — CHLORHEXIDINE GLUCONATE 0.12 % MT SOLN
OROMUCOSAL | Status: AC
  Administered 2024-04-24: 15 mL via OROMUCOSAL
  Filled 2024-04-24: qty 15

## 2024-04-24 MED ORDER — ONDANSETRON HCL 4 MG/2ML IJ SOLN
INTRAMUSCULAR | Status: AC
  Filled 2024-04-24: qty 2

## 2024-04-24 MED ORDER — ONDANSETRON HCL 4 MG/2ML IJ SOLN
INTRAMUSCULAR | Status: DC | PRN
Start: 2024-04-24 — End: 2024-04-24
  Administered 2024-04-24: 4 mg via INTRAVENOUS

## 2024-04-24 MED ORDER — MIDAZOLAM HCL 5 MG/5ML IJ SOLN
INTRAMUSCULAR | Status: DC | PRN
  Administered 2024-04-24: 2 mg via INTRAVENOUS

## 2024-04-24 MED ORDER — CHLORHEXIDINE GLUCONATE 0.12 % MT SOLN: 15.0000 mL | Freq: Once | OROMUCOSAL | Status: AC

## 2024-04-24 MED ORDER — SUGAMMADEX SODIUM 200 MG/2ML IV SOLN
INTRAVENOUS | Status: DC | PRN
  Administered 2024-04-24: 200 mg via INTRAVENOUS

## 2024-04-24 MED ORDER — DEXAMETHASONE SODIUM PHOSPHATE 10 MG/ML IJ SOLN
INTRAMUSCULAR | Status: DC | PRN
  Administered 2024-04-24: 5 mg via INTRAVENOUS

## 2024-04-24 MED ORDER — AMISULPRIDE (ANTIEMETIC) 5 MG/2ML IV SOLN: 10.0000 mg | Freq: Once | INTRAVENOUS | Status: DC | PRN

## 2024-04-24 MED ORDER — ACETAMINOPHEN 500 MG PO TABS
ORAL_TABLET | ORAL | Status: AC
  Administered 2024-04-24: 1000 mg via ORAL
  Filled 2024-04-24: qty 2

## 2024-04-24 MED ORDER — PROPOFOL 10 MG/ML IV BOLUS
INTRAVENOUS | Status: AC
  Filled 2024-04-24: qty 20

## 2024-04-24 MED ORDER — CEFAZOLIN SODIUM-DEXTROSE 2-4 GM/100ML-% IV SOLN
INTRAVENOUS | Status: AC
  Filled 2024-04-24: qty 100

## 2024-04-24 SURGICAL SUPPLY — 27 items
BAG COUNTER SPONGE SURGICOUNT (BAG) ×2 IMPLANT
BLADE CLIPPER SURG (BLADE) IMPLANT
CHLORAPREP W/TINT 26 (MISCELLANEOUS) ×2 IMPLANT
COVER SURGICAL LIGHT HANDLE (MISCELLANEOUS) ×2 IMPLANT
DERMABOND ADVANCED .7 DNX12 (GAUZE/BANDAGES/DRESSINGS) ×2 IMPLANT
DRAPE LAPAROSCOPIC ABDOMINAL (DRAPES) ×2 IMPLANT
ELECT CAUTERY BLADE 6.4 (BLADE) ×2 IMPLANT
ELECTRODE REM PT RTRN 9FT ADLT (ELECTROSURGICAL) ×2 IMPLANT
GAUZE 4X4 16PLY ~~LOC~~+RFID DBL (SPONGE) IMPLANT
GLOVE BIOGEL PI IND STRL 6 (GLOVE) ×2 IMPLANT
GLOVE BIOGEL PI MICRO STRL 5.5 (GLOVE) ×2 IMPLANT
GOWN STRL REUS W/ TWL LRG LVL3 (GOWN DISPOSABLE) ×4 IMPLANT
KIT BASIN OR (CUSTOM PROCEDURE TRAY) ×2 IMPLANT
KIT TURNOVER KIT B (KITS) ×2 IMPLANT
NDL HYPO 25GX1X1/2 BEV (NEEDLE) ×2 IMPLANT
NEEDLE HYPO 25GX1X1/2 BEV (NEEDLE) ×1 IMPLANT
NS IRRIG 1000ML POUR BTL (IV SOLUTION) ×2 IMPLANT
PACK GENERAL/GYN (CUSTOM PROCEDURE TRAY) ×2 IMPLANT
PAD ARMBOARD POSITIONER FOAM (MISCELLANEOUS) ×2 IMPLANT
PENCIL SMOKE EVACUATOR (MISCELLANEOUS) ×2 IMPLANT
SPIKE FLUID TRANSFER (MISCELLANEOUS) ×2 IMPLANT
SUT MNCRL AB 4-0 PS2 18 (SUTURE) ×2 IMPLANT
SUT NOVA NAB GS-21 0 18 T12 DT (SUTURE) IMPLANT
SUT VIC AB 3-0 SH 27X BRD (SUTURE) ×2 IMPLANT
SYR CONTROL 10ML LL (SYRINGE) ×2 IMPLANT
TOWEL GREEN STERILE (TOWEL DISPOSABLE) ×2 IMPLANT
TOWEL GREEN STERILE FF (TOWEL DISPOSABLE) IMPLANT

## 2024-04-24 NOTE — H&P (Signed)
 Alyssa Rodriguez is an 68 y.o. female.    HPI: Alyssa Rodriguez is a 68 y.o. female who is seen today as an office consultation for evaluation of New Consultation (Ventral hernia)   She has known about the hernia for a while, but says it only started bothering her in the last month after she was moving some furniture. The pain has improved since then. She has not had obstructive symptoms.   She has asthma and emphysema and follows with pulmonology at [facility]. She recently had PFTs on 7/31, which per the recent pulm note showed mild restriction. She is former smoker and quit in 2017. Her only prior abdominal surgeries are two C sections.   Past Medical History:  Diagnosis Date   Arthritis    per patient right ankle   Family history of adverse reaction to anesthesia    per patient her dad was hard to wake up after surgery   Hyperlipidemia    Hypertension    Lung mass    Osteoporosis    Peripheral artery disease (HCC)    Pre-diabetes    Pulmonary emphysema (HCC)    Sinusitis    Tobacco use     Past Surgical History:  Procedure Laterality Date   CATARACT EXTRACTION     CESAREAN SECTION     x2   NASAL SINUS SURGERY      Family History  Problem Relation Age of Onset   Lung cancer Neg Hx    Social History:  reports that she quit smoking about 7 years ago. Her smoking use included cigarettes. She started smoking about 37 years ago. She has a 60 pack-year smoking history. She has never used smokeless tobacco. She reports current alcohol use. She reports that she does not currently use drugs.  Allergies: No Known Allergies  Medications Prior to Admission  Medication Sig Dispense Refill   alendronate (FOSAMAX) 70 MG tablet Take 70 mg by mouth once a week.     amLODipine (NORVASC) 10 MG tablet Take 10 mg by mouth daily.     ascorbic acid (VITAMIN C) 500 MG tablet Take 500 mg by mouth daily.     atorvastatin (LIPITOR) 80 MG tablet Take 80 mg by mouth daily.     azelastine (ASTELIN)  0.1 % nasal spray Place 1 spray into both nostrils 2 (two) times daily.     CALCIUM PO Take 1 tablet by mouth daily.     cyanocobalamin (VITAMIN B12) 500 MCG tablet Take 500 mcg by mouth daily.     losartan (COZAAR) 100 MG tablet Take 100 mg by mouth daily.     meloxicam (MOBIC) 7.5 MG tablet Take 7.5 mg by mouth daily as needed for pain.     montelukast (SINGULAIR) 10 MG tablet Take 10 mg by mouth at bedtime.     Multiple Vitamins-Minerals (HAIR SKIN & NAILS) TABS Take 1 tablet by mouth daily.     Polyethylene Glycol 400 (BLINK TEARS OP) Place 1 drop into both eyes daily as needed (dry eyes).     VITAMIN D-VITAMIN K PO Take 1 tablet by mouth daily.     WIXELA INHUB 500-50 MCG/ACT AEPB Inhale 1 puff into the lungs in the morning and at bedtime.     zinc gluconate 50 MG tablet Take 50 mg by mouth daily.     albuterol  (VENTOLIN  HFA) 108 (90 Base) MCG/ACT inhaler Inhale 2 puffs into the lungs every 6 (six) hours as needed for wheezing or shortness of breath.  No results found for this or any previous visit (from the past 48 hours). No results found.  Review of Systems  Blood pressure 122/74, pulse 88, temperature 97.8 F (36.6 C), temperature source Oral, resp. rate 18, height 5' (1.524 m), weight 67 kg, SpO2 100%. Physical Exam Constitutional:      General: She is not in acute distress.    Appearance: Normal appearance.  Pulmonary:     Effort: Pulmonary effort is normal. No respiratory distress.  Abdominal:     General: There is no distension.     Palpations: Abdomen is soft.     Comments: Epigastric hernia, nonreducible.  Skin:    General: Skin is warm and dry.     Coloration: Skin is not jaundiced.  Neurological:     General: No focal deficit present.     Mental Status: She is alert and oriented to person, place, and time.      Assessment/Plan 68 yo female with a symptomatic epigastric hernia. Proceed to OR for open repair with possible mesh placement. Surgical site was  confirmed and marked. The procedure details were reviewed and informed consent obtained. All questions were answered. Plan for discharge home from PACU.  Alyssa LITTIE Dawn, MD 04/24/2024, 10:19 AM

## 2024-04-24 NOTE — Transfer of Care (Signed)
 Immediate Anesthesia Transfer of Care Note  Patient: Alyssa Rodriguez  Procedure(s) Performed: REPAIR, HERNIA, EPIGASTRIC, ADULT  Patient Location: PACU  Anesthesia Type:General  Level of Consciousness: awake, alert , oriented, and patient cooperative  Airway & Oxygen Therapy: Patient Spontanous Breathing and Patient connected to nasal cannula oxygen  Post-op Assessment: Report given to RN and Post -op Vital signs reviewed and stable  Post vital signs: Reviewed and stable  Last Vitals:  Vitals Value Taken Time  BP 123/76 04/24/24 11:30  Temp    Pulse 106 04/24/24 11:31  Resp 19 04/24/24 11:31  SpO2 97 % 04/24/24 11:31  Vitals shown include unfiled device data.  Last Pain:  Vitals:   04/24/24 0917  TempSrc:   PainSc: 0-No pain         Complications: No notable events documented.

## 2024-04-24 NOTE — Discharge Instructions (Addendum)
 [facility] DISCHARGE INSTRUCTIONS  Activity No heavy lifting greater than 15 pounds for 8 weeks after surgery. Ok to shower in 24 hours, but do not bathe or submerge incisions underwater. Do not drive while taking narcotic pain medication. You may drive when you are no longer taking prescription pain medication, you can comfortably wear a seatbelt, and you can safely maneuver your car and apply brakes.  Wound Care Your incisions are covered with skin glue called Dermabond. This will peel off on its own over time. You may shower and allow warm soapy water to run over your incisions. Gently pat dry. Do not submerge your incision underwater until cleared by your surgeon. Monitor your incision for any new redness, tenderness, or drainage. Many patients will experience some swelling and bruising at the incisions.  Ice packs will help.  Swelling and bruising can take several days to resolve.   Medications A  prescription for pain medication may be given to you upon discharge.  Take your pain medication as prescribed, if needed.  If narcotic pain medicine is not needed, then you may take acetaminophen  (Tylenol ) or ibuprofen (Advil) as needed. It is common to experience some constipation if taking pain medication after surgery.  Increasing fluid intake and taking a stool softener (such as Colace) will usually help or prevent this problem from occurring.  A mild laxative (Milk of Magnesia or Miralax) should be taken according to package directions if there are no bowel movements after 48 hours. Take your usually prescribed medications unless otherwise directed. If you need a refill on your pain medication, please contact your pharmacy.  They will contact our office to request authorization. Prescriptions will not be filled after 5 pm or on weekends.  When to Call Us : Fever greater than 100.5 New redness, drainage, or swelling at incision site Severe pain, nausea, or  vomiting Persistent bleeding from incisions  Follow-up You have an appointment scheduled with Dr. Dasie on May 09, 2024 at 3:10pm. This will be at the [facility] Surgery office at [address], [address], [place], [place]. Please arrive at least 15 minutes prior to your scheduled appointment time.  IF YOU HAVE DISABILITY OR FAMILY LEAVE FORMS, YOU MUST BRING THEM TO THE OFFICE FOR PROCESSING.   DO NOT GIVE THEM TO YOUR DOCTOR.  The clinic staff is available to answer your questions during regular business hours.  Please don't hesitate to call and ask to speak to one of the nurses for clinical concerns.  If you have a medical emergency, go to the nearest emergency room or call 911.  A surgeon from [facility] is always on call at the hospital  [address], [place], [place]  [address] ?  [address] [address], [place], [place]   [address] [phone] ? Toll Free: [phone] ? FAX [phone] Web site: [email or web address]      Managing Your Pain After Surgery Without Opioids    Thank you for participating in our program to help patients manage their pain after surgery without opioids. This is part of our effort to provide you with the best care possible, without exposing you or your family to the risk that opioids pose.  What pain can I expect after surgery? You can expect to have some pain after surgery. This is normal. The pain is typically worse the day after surgery, and quickly begins to get better. Many studies have found that many patients are able to manage their pain  after surgery with Over-the-Counter (OTC) medications such as Tylenol  and Motrin. If you have a condition that does not allow you to take Tylenol  or Motrin, notify your surgical team.  How will I manage my pain? The best strategy for controlling your pain after surgery is around the clock pain control with Tylenol  (acetaminophen ) and Motrin (ibuprofen or Advil).  Alternating these medications with each other allows you to maximize your pain control. In addition to Tylenol  and Motrin, you can use heating pads or ice packs on your incisions to help reduce your pain.  How will I alternate your regular strength over-the-counter pain medication? You will take a dose of pain medication every three hours. Start by taking 650 mg of Tylenol  (2 pills of 325 mg) 3 hours later take 600 mg of Motrin (3 pills of 200 mg) 3 hours after taking the Motrin take 650 mg of Tylenol  3 hours after that take 600 mg of Motrin.   - 1 -  See example - if your first dose of Tylenol  is at 12:00 PM   12:00 PM Tylenol  650 mg (2 pills of 325 mg)  3:00 PM Motrin 600 mg (3 pills of 200 mg)  6:00 PM Tylenol  650 mg (2 pills of 325 mg)  9:00 PM Motrin 600 mg (3 pills of 200 mg)  Continue alternating every 3 hours   We recommend that you follow this schedule around-the-clock for at least 3 days after surgery, or until you feel that it is no longer needed. Use the table on the last page of this handout to keep track of the medications you are taking. Important: Do not take more than 3000mg  of Tylenol  or 3200mg  of Motrin in a 24-hour period. Do not take ibuprofen/Motrin if you have a history of bleeding stomach ulcers, severe kidney disease, &/or actively taking a blood thinner  What if I still have pain? If you have pain that is not controlled with the over-the-counter pain medications (Tylenol  and Motrin or Advil) you might have what we call "breakthrough" pain. You will receive a prescription for a small amount of an opioid pain medication such as Oxycodone , Tramadol, or Tylenol  with Codeine. Use these opioid pills in the first 24 hours after surgery if you have breakthrough pain. Do not take more than 1 pill every 4-6 hours.  If you still have uncontrolled pain after using all opioid pills, don't hesitate to call our staff using the number provided. We will help make sure you are  managing your pain in the best way possible, and if necessary, we can provide a prescription for additional pain medication.   Day 1    Time  Name of Medication Number of pills taken  Amount of Acetaminophen   Pain Level   Comments  AM PM       AM PM       AM PM       AM PM       AM PM       AM PM       AM PM       AM PM       Total Daily amount of Acetaminophen  Do not take more than  3,000 mg per day      Day 2    Time  Name of Medication Number of pills taken  Amount of Acetaminophen   Pain Level   Comments  AM PM       AM PM  AM PM       AM PM       AM PM       AM PM       AM PM       AM PM       Total Daily amount of Acetaminophen  Do not take more than  3,000 mg per day      Day 3    Time  Name of Medication Number of pills taken  Amount of Acetaminophen   Pain Level   Comments  AM PM       AM PM       AM PM       AM PM         AM PM       AM PM       AM PM       AM PM       Total Daily amount of Acetaminophen  Do not take more than  3,000 mg per day      Day 4    Time  Name of Medication Number of pills taken  Amount of Acetaminophen   Pain Level   Comments  AM PM       AM PM       AM PM       AM PM       AM PM       AM PM       AM PM       AM PM       Total Daily amount of Acetaminophen  Do not take more than  3,000 mg per day      Day 5    Time  Name of Medication Number of pills taken  Amount of Acetaminophen   Pain Level   Comments  AM PM       AM PM       AM PM       AM PM       AM PM       AM PM       AM PM       AM PM       Total Daily amount of Acetaminophen  Do not take more than  3,000 mg per day      Day 6    Time  Name of Medication Number of pills taken  Amount of Acetaminophen   Pain Level  Comments  AM PM       AM PM       AM PM       AM PM       AM PM       AM PM       AM PM       AM PM       Total Daily amount of Acetaminophen  Do not take more than  3,000 mg per day       Day 7    Time  Name of Medication Number of pills taken  Amount of Acetaminophen   Pain Level   Comments  AM PM       AM PM       AM PM       AM PM       AM PM       AM PM       AM PM       AM PM       Total Daily amount of Acetaminophen   Do not take more than  3,000 mg per day        For additional information about how and where to safely dispose of unused opioid medications - [email or web address]  Disclaimer: This document contains information and/or instructional materials adapted from [facility] for the typical patient with your condition. It does not replace medical advice from your health care provider because your experience may differ from that of the typical patient. Talk to your health care provider if you have any questions about this document, your condition or your treatment plan. Adapted from [facility]

## 2024-04-24 NOTE — Anesthesia Procedure Notes (Signed)
 Procedure Name: Intubation Date/Time: 04/24/2024 10:52 AM  Performed by: Claudene Arlin LABOR, CRNAPre-anesthesia Checklist: Patient identified, Emergency Drugs available, Suction available and Patient being monitored Patient Re-evaluated:Patient Re-evaluated prior to induction Oxygen Delivery Method: Circle system utilized Preoxygenation: Pre-oxygenation with 100% oxygen Induction Type: IV induction Ventilation: Mask ventilation without difficulty and Oral airway inserted - appropriate to patient size Laryngoscope Size: Cleotilde and 2 Grade View: Grade I Tube type: Oral Tube size: 7.0 mm Number of attempts: 1 Airway Equipment and Method: Stylet and Oral airway Placement Confirmation: ETT inserted through vocal cords under direct vision, positive ETCO2 and breath sounds checked- equal and bilateral Secured at: 21 cm Tube secured with: Tape Dental Injury: Teeth and Oropharynx as per pre-operative assessment

## 2024-04-24 NOTE — Anesthesia Preprocedure Evaluation (Addendum)
 Anesthesia Evaluation  Patient identified by MRN, date of birth, ID band Patient awake    Reviewed: Allergy & Precautions, NPO status , Patient's Chart, lab work & pertinent test results  Airway Mallampati: III  TM Distance: >3 FB Neck ROM: Full    Dental no notable dental hx. (+) Teeth Intact, Dental Advisory Given   Pulmonary COPD,  COPD inhaler, former smoker   Pulmonary exam normal breath sounds clear to auscultation       Cardiovascular hypertension, Pt. on medications + Peripheral Vascular Disease  Normal cardiovascular exam Rhythm:Regular Rate:Normal     Neuro/Psych negative neurological ROS  negative psych ROS   GI/Hepatic negative GI ROS, Neg liver ROS,,,  Endo/Other  diabetes (prediabetes)    Renal/GU negative Renal ROS  negative genitourinary   Musculoskeletal  (+) Arthritis ,    Abdominal   Peds  Hematology negative hematology ROS (+)   Anesthesia Other Findings   Reproductive/Obstetrics                              Anesthesia Physical Anesthesia Plan  ASA: 3  Anesthesia Plan: General   Post-op Pain Management: Tylenol  PO (pre-op)*   Induction: Intravenous  PONV Risk Score and Plan: 3 and Midazolam , Dexamethasone  and Ondansetron   Airway Management Planned: Oral ETT  Additional Equipment:   Intra-op Plan:   Post-operative Plan: Extubation in OR  Informed Consent: I have reviewed the patients History and Physical, chart, labs and discussed the procedure including the risks, benefits and alternatives for the proposed anesthesia with the patient or authorized representative who has indicated his/her understanding and acceptance.     Dental advisory given  Plan Discussed with: CRNA  Anesthesia Plan Comments:          Anesthesia Quick Evaluation

## 2024-04-24 NOTE — Op Note (Signed)
 Date: 04/24/24  Patient: Alyssa Rodriguez MRN: [id number]  Preoperative Diagnosis: Epigastric hernia Postoperative Diagnosis: Same  Procedure: Open primary repair of epigastric hernia  Surgeon: Leonor Dawn, MD  EBL: Minimal  Anesthesia: General  Specimens: None  Indications: [name] is a 68 yo female who was referred with an epigastric hernia, which has recently become more symptomatic. After a discussion of the risks and benefits of surgery, she consented to proceed.  Findings: Epigastric hernia containing viable omentum, with a fascial defect of 1.5cm, repaired primarily.  Procedure details: Informed consent was obtained in the preoperative area prior to the procedure. The patient was brought to the operating room and placed on the table in the supine position. General anesthesia was induced and appropriate lines and drains were placed for intraoperative monitoring. Perioperative antibiotics were administered per SCIP guidelines. The abdomen was prepped and draped in the usual sterile fashion. A pre-procedure timeout was taken verifying patient identity, surgical site and procedure to be performed.  A small vertical skin incision was made overlying the hernia, and the subcutaneous tissue was divided with cautery. A hernia sac was encountered, which was bluntly dissected out of the surrounding tissue. The sac was dissected off the fascia circumferentially using blunt dissection and cautery. The sac was discarded. The hernia contained viable omentum, which was fully reduced back into the abdomen. The hernia defect was measured at 1.5cm. The deep edges of the fascia were palpated and were free of any adhesions. The hernia defect was closed primarily using interrupted 0 Novafil sutures. Hemostasis was achieved in the wound with cautery. Scarpa's layer was closed with running 3-0 Vicryl suture, and the skin was closed with running subcuticular 4-0 monocryl suture. Dermabond was  applied.  The patient tolerated the procedure well with no apparent complications. All counts were correct x2 at the end of the procedure. The patient was extubated and taken to PACU in stable condition.  Leonor Dawn, MD 04/24/24 11:26 AM

## 2024-04-24 NOTE — Anesthesia Postprocedure Evaluation (Signed)
 Anesthesia Post Note  Patient: Alyssa Rodriguez  Procedure(s) Performed: REPAIR, HERNIA, EPIGASTRIC, ADULT     Patient location during evaluation: PACU Anesthesia Type: General Level of consciousness: awake and alert Pain management: pain level controlled Vital Signs Assessment: post-procedure vital signs reviewed and stable Respiratory status: spontaneous breathing, nonlabored ventilation, respiratory function stable and patient connected to nasal cannula oxygen Cardiovascular status: blood pressure returned to baseline and stable Postop Assessment: no apparent nausea or vomiting Anesthetic complications: no   No notable events documented.  Last Vitals:  Vitals:   04/24/24 1145 04/24/24 1200  BP: 111/76 114/76  Pulse: 94 89  Resp: 15 14  Temp:  36.9 C  SpO2: 97% 96%    Last Pain:  Vitals:   04/24/24 1200  TempSrc:   PainSc: 0-No pain                 Joory Gough L Gisele Pack

## 2024-04-25 ENCOUNTER — Encounter (HOSPITAL_COMMUNITY): Payer: Self-pay | Admitting: Surgery

## 2024-04-27 DIAGNOSIS — K439 Ventral hernia without obstruction or gangrene: Secondary | ICD-10-CM | POA: Diagnosis not present

## 2024-04-27 DIAGNOSIS — Z4889 Encounter for other specified surgical aftercare: Secondary | ICD-10-CM | POA: Diagnosis not present

## 2024-05-04 DIAGNOSIS — D72119 Hypereosinophilic syndrome (hes), unspecified: Secondary | ICD-10-CM | POA: Diagnosis not present

## 2024-05-04 DIAGNOSIS — J329 Chronic sinusitis, unspecified: Secondary | ICD-10-CM | POA: Diagnosis not present

## 2024-05-04 DIAGNOSIS — R911 Solitary pulmonary nodule: Secondary | ICD-10-CM | POA: Diagnosis not present

## 2024-05-04 DIAGNOSIS — Z23 Encounter for immunization: Secondary | ICD-10-CM | POA: Diagnosis not present

## 2024-05-04 DIAGNOSIS — D721 Eosinophilia, unspecified: Secondary | ICD-10-CM | POA: Diagnosis not present

## 2024-05-04 DIAGNOSIS — J339 Nasal polyp, unspecified: Secondary | ICD-10-CM | POA: Diagnosis not present

## 2024-05-04 DIAGNOSIS — J4550 Severe persistent asthma, uncomplicated: Secondary | ICD-10-CM | POA: Diagnosis not present

## 2024-05-09 DIAGNOSIS — J439 Emphysema, unspecified: Secondary | ICD-10-CM | POA: Diagnosis not present

## 2024-05-09 DIAGNOSIS — M810 Age-related osteoporosis without current pathological fracture: Secondary | ICD-10-CM | POA: Diagnosis not present

## 2024-05-18 DIAGNOSIS — J4550 Severe persistent asthma, uncomplicated: Secondary | ICD-10-CM | POA: Diagnosis not present
